# Patient Record
Sex: Female | Born: 1940
Health system: Southern US, Community
[De-identification: ages and names within clinical notes are randomized; demographics above are authoritative.]

## PROBLEM LIST (undated history)

## (undated) DIAGNOSIS — E042 Nontoxic multinodular goiter: Secondary | ICD-10-CM

## (undated) DIAGNOSIS — M1990 Unspecified osteoarthritis, unspecified site: Secondary | ICD-10-CM

## (undated) DIAGNOSIS — Z853 Personal history of malignant neoplasm of breast: Secondary | ICD-10-CM

## (undated) DIAGNOSIS — I00 Rheumatic fever without heart involvement: Secondary | ICD-10-CM

## (undated) DIAGNOSIS — E119 Type 2 diabetes mellitus without complications: Secondary | ICD-10-CM

## (undated) DIAGNOSIS — C801 Malignant (primary) neoplasm, unspecified: Secondary | ICD-10-CM

## (undated) DIAGNOSIS — R112 Nausea with vomiting, unspecified: Secondary | ICD-10-CM

## (undated) DIAGNOSIS — E039 Hypothyroidism, unspecified: Secondary | ICD-10-CM

## (undated) DIAGNOSIS — Z9889 Other specified postprocedural states: Secondary | ICD-10-CM

## (undated) DIAGNOSIS — I10 Essential (primary) hypertension: Secondary | ICD-10-CM

## (undated) HISTORY — PX: TONSILLECTOMY: SUR1361

## (undated) HISTORY — DX: Nontoxic multinodular goiter: E04.2

## (undated) HISTORY — PX: TONSILLECTOMY: SHX5217

## (undated) HISTORY — DX: Type 2 diabetes mellitus without complications: E11.9

## (undated) HISTORY — DX: Essential (primary) hypertension: I10

## (undated) HISTORY — DX: Rheumatic fever without heart involvement: I00

## (undated) HISTORY — DX: Personal history of malignant neoplasm of breast: Z85.3

## (undated) HISTORY — PX: APPENDECTOMY: SHX54

---

## 1991-12-29 DIAGNOSIS — Z853 Personal history of malignant neoplasm of breast: Secondary | ICD-10-CM

## 1991-12-29 HISTORY — PX: MASTECTOMY: SHX3

## 1991-12-29 HISTORY — PX: BREAST SURGERY: SHX581

## 1991-12-29 HISTORY — DX: Personal history of malignant neoplasm of breast: Z85.3

## 1998-06-04 ENCOUNTER — Ambulatory Visit (HOSPITAL_COMMUNITY): Admission: RE | Admit: 1998-06-04 | Discharge: 1998-06-04 | Payer: Self-pay | Admitting: General Surgery

## 1999-06-02 ENCOUNTER — Ambulatory Visit (HOSPITAL_COMMUNITY): Admission: RE | Admit: 1999-06-02 | Discharge: 1999-06-02 | Payer: Self-pay | Admitting: General Surgery

## 1999-06-02 ENCOUNTER — Encounter: Payer: Self-pay | Admitting: General Surgery

## 2002-03-03 ENCOUNTER — Encounter: Payer: Self-pay | Admitting: Family Medicine

## 2002-03-03 ENCOUNTER — Ambulatory Visit (HOSPITAL_COMMUNITY): Admission: RE | Admit: 2002-03-03 | Discharge: 2002-03-03 | Payer: Self-pay | Admitting: Family Medicine

## 2003-04-13 ENCOUNTER — Ambulatory Visit (HOSPITAL_COMMUNITY): Admission: RE | Admit: 2003-04-13 | Discharge: 2003-04-13 | Payer: Self-pay | Admitting: Family Medicine

## 2005-09-16 ENCOUNTER — Ambulatory Visit (HOSPITAL_COMMUNITY): Admission: RE | Admit: 2005-09-16 | Discharge: 2005-09-16 | Payer: Self-pay | Admitting: Family Medicine

## 2007-01-28 ENCOUNTER — Ambulatory Visit (HOSPITAL_COMMUNITY): Admission: RE | Admit: 2007-01-28 | Discharge: 2007-01-28 | Payer: Self-pay | Admitting: Family Medicine

## 2007-09-09 ENCOUNTER — Ambulatory Visit (HOSPITAL_COMMUNITY): Admission: RE | Admit: 2007-09-09 | Discharge: 2007-09-09 | Payer: Self-pay | Admitting: Internal Medicine

## 2007-09-09 ENCOUNTER — Ambulatory Visit: Payer: Self-pay | Admitting: Internal Medicine

## 2007-10-26 ENCOUNTER — Encounter (HOSPITAL_COMMUNITY): Admission: RE | Admit: 2007-10-26 | Discharge: 2007-11-25 | Payer: Self-pay | Admitting: Endocrinology

## 2008-09-20 ENCOUNTER — Other Ambulatory Visit: Admission: RE | Admit: 2008-09-20 | Discharge: 2008-09-20 | Payer: Self-pay | Admitting: Obstetrics and Gynecology

## 2009-11-13 ENCOUNTER — Ambulatory Visit (HOSPITAL_COMMUNITY): Admission: RE | Admit: 2009-11-13 | Discharge: 2009-11-13 | Payer: Self-pay | Admitting: Internal Medicine

## 2010-11-24 ENCOUNTER — Ambulatory Visit (HOSPITAL_COMMUNITY): Admission: RE | Admit: 2010-11-24 | Discharge: 2010-11-24 | Payer: Self-pay | Admitting: Internal Medicine

## 2011-05-12 NOTE — Op Note (Signed)
NAME:  Ashley Wise, Ashley Wise           ACCOUNT NO.:  [id number]   MEDICAL RECORD NO.:  [id number]          PATIENT TYPE:  AMB   LOCATION:  DAY                           FACILITY:  [facility]   PHYSICIAN:  R. Roetta Sessions, M.D. DATE OF BIRTH:  03/18/1941   DATE OF PROCEDURE:  09/09/2007  DATE OF DISCHARGE:                               OPERATIVE REPORT   PROCEDURE PERFORMED:  Screening colonoscopy.   INDICATIONS FOR PROCEDURE:  70 year old lady with no lower GI tract  symptoms referred by Dr. Ouida Sills for her first ever colonoscopy.  She has  a personal history of breast cancer, positive family history of colon  cancer in her dad who was diagnosed in his early 58s. Colonoscopy is now  being done.  This approach has been discussed with the patient at  length.  The potential risks, benefits and alternatives have been  reviewed, questions answered, she is agreeable, please see documentation  in the medical record.   PROCEDURE NOTE:  O2 saturation, blood pressure, pulse and respirations  were monitored throughout the entire procedure.  Conscious sedation with  Versed 2 mg IV and Demerol 50 mg IV in a single dose. Instrumentation  Pentax video chip system.   FINDINGS:  Digital rectal exam revealed no abnormalities.  The prep was  good.  The colonic mucosa was surveyed from the rectosigmoid junction  through the left, transverse, right colon to the appendiceal orifice,  ileocecal valve, and cecum.  These structures were well seen and  photographed for the record.  The ileocecal valve was somewhat fatty and  asymmetrical but otherwise, appeared normal.  I easily intubated the  terminal ileum 10 cm. From this level, the scope was slowly withdrawn.  All previously mentioned mucosal surfaces were again seen.  The patient  a few scattered left sided diverticula.  The remainder of the colonic  mucosa appeared normal. Normal terminal ileum mucosa.  The scope was  pulled down in the rectum where a thorough  examination of the rectal  mucosa including retroflex view of the anal verge demonstrated no  abnormalities.  The patient tolerated the procedure well and was reacted  in endoscopy.   IMPRESSION:  Normal rectum, a few scattered pancolonic diverticula, the  colonic mucosa of the terminal ileum appeared normal.   RECOMMENDATIONS:  1. Diverticulosis literature provided to [name].  2. Recommend repeat screening colonoscopy in five years.      Jonathon Bellows, M.D.  Electronically Signed     RMR/MEDQ  D:  09/09/2007  T:  09/09/2007  Job:  [id number]   cc:   Kingsley Callander. Ouida Sills, MD  Fax: [phone]

## 2011-09-15 ENCOUNTER — Other Ambulatory Visit: Payer: Self-pay | Admitting: Obstetrics & Gynecology

## 2011-09-15 DIAGNOSIS — E049 Nontoxic goiter, unspecified: Secondary | ICD-10-CM

## 2011-09-18 ENCOUNTER — Ambulatory Visit (HOSPITAL_COMMUNITY)
Admission: RE | Admit: 2011-09-18 | Discharge: 2011-09-18 | Disposition: A | Discharge status: Home / Self Care | Payer: Medicare Other | Source: Ambulatory Visit | Attending: Obstetrics & Gynecology | Admitting: Obstetrics & Gynecology

## 2011-09-18 DIAGNOSIS — E049 Nontoxic goiter, unspecified: Secondary | ICD-10-CM

## 2011-09-18 DIAGNOSIS — E042 Nontoxic multinodular goiter: Secondary | ICD-10-CM | POA: Insufficient documentation

## 2011-09-22 ENCOUNTER — Other Ambulatory Visit: Payer: Self-pay | Admitting: Obstetrics & Gynecology

## 2011-09-22 DIAGNOSIS — E049 Nontoxic goiter, unspecified: Secondary | ICD-10-CM

## 2011-09-28 ENCOUNTER — Other Ambulatory Visit: Payer: Self-pay | Admitting: Obstetrics & Gynecology

## 2011-09-28 DIAGNOSIS — E042 Nontoxic multinodular goiter: Secondary | ICD-10-CM

## 2011-09-29 ENCOUNTER — Ambulatory Visit (HOSPITAL_COMMUNITY): Payer: Medicare Other

## 2011-09-29 ENCOUNTER — Encounter (HOSPITAL_COMMUNITY): Payer: Medicare Other

## 2011-10-02 ENCOUNTER — Ambulatory Visit (HOSPITAL_COMMUNITY)
Admission: RE | Admit: 2011-10-02 | Discharge: 2011-10-02 | Disposition: A | Discharge status: Home / Self Care | Payer: Medicare Other | Source: Ambulatory Visit | Attending: Obstetrics & Gynecology | Admitting: Obstetrics & Gynecology

## 2011-10-02 ENCOUNTER — Other Ambulatory Visit: Payer: Self-pay | Admitting: Obstetrics & Gynecology

## 2011-10-02 ENCOUNTER — Inpatient Hospital Stay (HOSPITAL_COMMUNITY): Admission: RE | Admit: 2011-10-02 | Discharge: 2011-10-02 | Payer: Self-pay | Source: Ambulatory Visit

## 2011-10-02 DIAGNOSIS — E049 Nontoxic goiter, unspecified: Secondary | ICD-10-CM

## 2011-10-02 DIAGNOSIS — E042 Nontoxic multinodular goiter: Secondary | ICD-10-CM

## 2011-10-02 NOTE — Procedures (Signed)
PreOperative Dx: Multinodular goiter Postoperative Dx: Multinodular goiter Procedure:   US guided thyroid nodule FNA x 2 Radiologist:  Tyron Russell Anesthesia:  1 ml 1% xylocaine Specimen:  FNA x 3 right/isthmic, FNA x 4 left EBL:   None Complications: None

## 2011-10-02 NOTE — Progress Notes (Signed)
1040  Time out performed by all staff members present.  Pt for US Thyroid Biopsy. 1045  Lidocaine 5 cc injected into mid left neck area and procedure started. 1100 Procedure complete.  Pt tolerated procedure well.

## 2011-11-10 ENCOUNTER — Ambulatory Visit (INDEPENDENT_AMBULATORY_CARE_PROVIDER_SITE_OTHER): Payer: Medicare Other | Admitting: Surgery

## 2011-11-10 ENCOUNTER — Encounter (INDEPENDENT_AMBULATORY_CARE_PROVIDER_SITE_OTHER): Payer: Self-pay | Admitting: Surgery

## 2011-11-10 DIAGNOSIS — E0520 Thyrotoxicosis with toxic multinodular goiter without thyrotoxic crisis or storm: Secondary | ICD-10-CM

## 2011-11-10 NOTE — Progress Notes (Signed)
Chief Complaint  Patient presents with  . New Evaluation    multinodular goiter - referral by Dr. Turner Daniels / Dr. Carylon Perches    HISTORY: Patient is a 70 year old white female referred by her gynecologist and her primary care physician for evaluation of multinodular thyroid goiter. Patient was originally diagnosed with goiter 19 years ago. It has gradually increased in size. Recent ultrasound showed the left lobe to be enlarged at 7.9 cm and the right lobe of the enlarged at 6.2 cm. There are dominant nodules ranging from 3-5 cm. Fine needle aspiration biopsy has been performed and shows nonneoplastic goiter. However a recent TSH level is suppressed at 0.2. This suggests onset of toxic multinodular goiter.  Patient does have palpitations. She does note tremors. She does note that the gland has increased in size and she has had increased dysphagia, specifically with liquids.  Patient denies any globus sensation. She denies discomfort.  There is no family history of thyroid disease. The patient has had no other head or neck surgery. There is no family history of other endocrinopathy.   Past Medical History  Diagnosis Date  . History of breast cancer     left  . Multinodular goiter      Current Outpatient Prescriptions  Medication Sig Dispense Refill  . B Complex-C (B-COMPLEX WITH VITAMIN C) tablet Take 1 tablet by mouth daily.        . fish oil-omega-3 fatty acids 1000 MG capsule Take 2 g by mouth daily.           No Known Allergies   Family History  Problem Relation Age of Onset  . Cancer Father     colon, stomach, liver     History   Social History  . Marital Status: Married    Spouse Name: N/A    Number of Children: N/A  . Years of Education: N/A   Social History Main Topics  . Smoking status: Never Smoker   . Smokeless tobacco: None  . Alcohol Use: No  . Drug Use: No  . Sexually Active:    Other Topics Concern  . None   Social History Narrative  . None      REVIEW OF SYSTEMS - PERTINENT POSITIVES ONLY: Positive for palpitations, positive for tremor, positive for liquid dysphagia.   EXAM: Filed Vitals:   11/10/11 1527  BP: 160/68  Pulse: 72  Temp: 97.5 F (36.4 C)  Resp: 16    HEENT: normocephalic; pupils equal and reactive; sclerae clear; dentition good; mucous membranes moist NECK:  Large thyroid gland with multiple nodules bilaterally; asymmetric on extension with left greater than right; no palpable anterior or posterior cervical lymphadenopathy; no supraclavicular masses; no tenderness CHEST: clear to auscultation bilaterally without rales, rhonchi, or wheezes CARDIAC: regular rate and rhythm without significant murmur; peripheral pulses are full EXT:  non-tender without edema; no deformity NEURO: no gross focal deficits; no sign of tremor   LABORATORY RESULTS: See E-Chart for most recent results   RADIOLOGY RESULTS: See E-Chart or I-Site for most recent results   IMPRESSION: #1 multinodular goiter with mild to moderate compressive symptoms #2 hyperthyroidism, moderately symptomatic   PLAN: I had a lengthy discussion with the patient and her sister regarding options for management. I have recommended total thyroidectomy. We discussed the risks and benefits of this procedure including the potential for recurrent laryngeal nerve injury and injury to parathyroid glands. We discussed the operation, the hospital stay, and the postoperative recovery period we discussed  the need for lifelong thyroid hormone replacement therapy. They understand and agree to proceed. We will make arrangements for surgery at a time convenient for the patient in the near future.  The risks and benefits of the procedure have been discussed at length with the patient.  The patient understands the proposed procedure, potential alternative treatments, and the course of recovery to be expected.  All of the patient's questions have been answered at this  time.  The patient wishes to proceed with surgery and will schedule a date for their procedure through our office staff.   Velora Heckler, MD, FACS General & Endocrine Surgery [facility]    Visit Diagnoses: 1. Goiter, toxic, multinodular     Primary Care Physician: Carylon Perches, MD  GYN:  Dr. Turner Daniels

## 2011-11-17 ENCOUNTER — Other Ambulatory Visit: Payer: Self-pay | Admitting: Obstetrics & Gynecology

## 2011-11-17 DIAGNOSIS — Z139 Encounter for screening, unspecified: Secondary | ICD-10-CM

## 2011-11-27 ENCOUNTER — Ambulatory Visit (HOSPITAL_COMMUNITY)
Admission: RE | Admit: 2011-11-27 | Discharge: 2011-11-27 | Disposition: A | Discharge status: Home / Self Care | Payer: Medicare Other | Source: Ambulatory Visit | Attending: Obstetrics & Gynecology | Admitting: Obstetrics & Gynecology

## 2011-11-27 DIAGNOSIS — Z139 Encounter for screening, unspecified: Secondary | ICD-10-CM

## 2011-11-27 DIAGNOSIS — Z1231 Encounter for screening mammogram for malignant neoplasm of breast: Secondary | ICD-10-CM | POA: Insufficient documentation

## 2011-12-14 ENCOUNTER — Ambulatory Visit (HOSPITAL_COMMUNITY): Admission: RE | Admit: 2011-12-14 | Payer: Medicare Other | Source: Ambulatory Visit | Admitting: Surgery

## 2011-12-14 ENCOUNTER — Encounter (HOSPITAL_COMMUNITY): Admission: RE | Payer: Self-pay | Source: Ambulatory Visit

## 2011-12-14 SURGERY — THYROIDECTOMY
Anesthesia: General

## 2012-01-11 ENCOUNTER — Encounter (HOSPITAL_COMMUNITY): Payer: Self-pay | Admitting: Pharmacy Technician

## 2012-01-21 ENCOUNTER — Encounter (HOSPITAL_COMMUNITY): Payer: Self-pay

## 2012-01-21 ENCOUNTER — Encounter (HOSPITAL_COMMUNITY)
Admission: RE | Admit: 2012-01-21 | Discharge: 2012-01-21 | Disposition: A | Discharge status: Home / Self Care | Payer: Medicare Other | Source: Ambulatory Visit | Attending: Surgery | Admitting: Surgery

## 2012-01-21 ENCOUNTER — Ambulatory Visit (HOSPITAL_COMMUNITY)
Admission: RE | Admit: 2012-01-21 | Discharge: 2012-01-21 | Disposition: A | Discharge status: Home / Self Care | Payer: Medicare Other | Source: Ambulatory Visit | Attending: Surgery | Admitting: Surgery

## 2012-01-21 ENCOUNTER — Other Ambulatory Visit: Payer: Self-pay

## 2012-01-21 DIAGNOSIS — Z9010 Acquired absence of unspecified breast and nipple: Secondary | ICD-10-CM | POA: Insufficient documentation

## 2012-01-21 DIAGNOSIS — E049 Nontoxic goiter, unspecified: Secondary | ICD-10-CM | POA: Insufficient documentation

## 2012-01-21 HISTORY — DX: Other specified postprocedural states: Z98.890

## 2012-01-21 HISTORY — DX: Other specified postprocedural states: R11.2

## 2012-01-21 HISTORY — DX: Malignant (primary) neoplasm, unspecified: C80.1

## 2012-01-21 HISTORY — DX: Unspecified osteoarthritis, unspecified site: M19.90

## 2012-01-21 LAB — CBC
MCV: 88.8 fL (ref 78.0–100.0)
Platelets: 263 10*3/uL (ref 150–400)
RBC: 4.29 MIL/uL (ref 3.87–5.11)
RDW: 13 % (ref 11.5–15.5)
WBC: 7.2 10*3/uL (ref 4.0–10.5)

## 2012-01-21 LAB — BASIC METABOLIC PANEL
Creatinine, Ser: 0.7 mg/dL (ref 0.50–1.10)
GFR calc non Af Amer: 86 mL/min — ABNORMAL LOW (ref >90)
Sodium: 140 mEq/L (ref 135–145)

## 2012-01-21 LAB — URINALYSIS, ROUTINE W REFLEX MICROSCOPIC
Glucose, UA: NEGATIVE mg/dL
Ketones, ur: NEGATIVE mg/dL
Protein, ur: NEGATIVE mg/dL
Urobilinogen, UA: 0.2 mg/dL (ref 0.0–1.0)

## 2012-01-21 LAB — SURGICAL PCR SCREEN: MRSA, PCR: NEGATIVE

## 2012-01-21 LAB — PROTIME-INR
INR: 0.94 (ref 0.00–1.49)
Prothrombin Time: 12.8 seconds (ref 11.6–15.2)

## 2012-01-21 LAB — URINE MICROSCOPIC-ADD ON

## 2012-01-21 NOTE — Patient Instructions (Addendum)
20 Ashley Wise  01/21/2012   Your procedure is scheduled on:  Monday 1/28  AT 7:30 AM  Report to [facility] at 5:30 AM.  Call this number if you have problems the morning of surgery: [phone]   Remember:   Do not eat food OR DRINK ANYTHING AFTER MIDNIGHT THE NIGHT BEFORE SURGERY.    Take these medicines the morning of surgery with A SIP OF WATER: NO MEDICINES TO TAKE   Do not wear jewelry, make-up or nail polish.  Do not wear lotions, powders, or perfumes.   Do not shave 48 hours prior to surgery.  Do not bring valuables to the hospital.  Contacts, dentures or bridgework may not be worn into surgery.  Leave suitcase in the car. After surgery it may be brought to your room.  For patients admitted to the hospital, checkout time is 11:00 AM the day of discharge.   Patients discharged the day of surgery will not be allowed to drive home.    Special Instructions: CHG Shower Use Special Wash: 1/2 bottle night before surgery and 1/2 bottle morning of surgery.   Please read over the following fact sheets that you were given: MRSA Information

## 2012-01-25 ENCOUNTER — Encounter (HOSPITAL_COMMUNITY): Payer: Self-pay | Admitting: Anesthesiology

## 2012-01-25 ENCOUNTER — Encounter (HOSPITAL_COMMUNITY)
Admission: RE | Disposition: A | Discharge status: Home / Self Care | Payer: Self-pay | Source: Ambulatory Visit | Attending: Surgery

## 2012-01-25 ENCOUNTER — Encounter (HOSPITAL_COMMUNITY): Payer: Self-pay

## 2012-01-25 ENCOUNTER — Inpatient Hospital Stay (HOSPITAL_COMMUNITY)
Admission: RE | Admit: 2012-01-25 | Discharge: 2012-01-26 | DRG: 627 | Disposition: A | Discharge status: Home / Self Care | Payer: Medicare Other | Source: Ambulatory Visit | Attending: Surgery | Admitting: Surgery

## 2012-01-25 ENCOUNTER — Other Ambulatory Visit (INDEPENDENT_AMBULATORY_CARE_PROVIDER_SITE_OTHER): Payer: Self-pay | Admitting: Surgery

## 2012-01-25 ENCOUNTER — Ambulatory Visit (HOSPITAL_COMMUNITY): Payer: Medicare Other | Admitting: Anesthesiology

## 2012-01-25 DIAGNOSIS — Z853 Personal history of malignant neoplasm of breast: Secondary | ICD-10-CM

## 2012-01-25 DIAGNOSIS — E0520 Thyrotoxicosis with toxic multinodular goiter without thyrotoxic crisis or storm: Secondary | ICD-10-CM

## 2012-01-25 DIAGNOSIS — E042 Nontoxic multinodular goiter: Principal | ICD-10-CM | POA: Diagnosis present

## 2012-01-25 DIAGNOSIS — Z9010 Acquired absence of unspecified breast and nipple: Secondary | ICD-10-CM

## 2012-01-25 HISTORY — PX: THYROIDECTOMY: SHX17

## 2012-01-25 SURGERY — THYROIDECTOMY
Anesthesia: General | Site: Abdomen | Wound class: Clean

## 2012-01-25 MED ORDER — ONDANSETRON HCL 4 MG/2ML IJ SOLN
INTRAMUSCULAR | Status: DC | PRN
  Administered 2012-01-25: 4 mg via INTRAVENOUS
  Administered 2012-01-25: 2 mg via INTRAVENOUS

## 2012-01-25 MED ORDER — FENTANYL CITRATE 0.05 MG/ML IJ SOLN
INTRAMUSCULAR | Status: DC | PRN
  Administered 2012-01-25 (×2): 50 ug via INTRAVENOUS
  Administered 2012-01-25: 25 ug via INTRAVENOUS
  Administered 2012-01-25: 100 ug via INTRAVENOUS
  Administered 2012-01-25 (×2): 50 ug via INTRAVENOUS

## 2012-01-25 MED ORDER — GLYCOPYRROLATE 0.2 MG/ML IJ SOLN
INTRAMUSCULAR | Status: DC | PRN
  Administered 2012-01-25: .5 mg via INTRAVENOUS

## 2012-01-25 MED ORDER — CALCIUM CARBONATE 1250 (500 CA) MG PO TABS
1250.0000 mg | ORAL_TABLET | Freq: Three times a day (TID) | ORAL | Status: DC
  Administered 2012-01-25 – 2012-01-26 (×4): 1250 mg via ORAL
  Filled 2012-01-25 (×6): qty 1

## 2012-01-25 MED ORDER — METOCLOPRAMIDE HCL 5 MG/ML IJ SOLN
INTRAMUSCULAR | Status: DC | PRN
  Administered 2012-01-25: 10 mg via INTRAVENOUS

## 2012-01-25 MED ORDER — HYDROMORPHONE HCL PF 1 MG/ML IJ SOLN: 1.0000 mg | INTRAMUSCULAR | Status: DC | PRN

## 2012-01-25 MED ORDER — LIDOCAINE HCL (CARDIAC) 20 MG/ML IV SOLN
INTRAVENOUS | Status: DC | PRN
  Administered 2012-01-25: 40 mg via INTRAVENOUS

## 2012-01-25 MED ORDER — ACETAMINOPHEN 10 MG/ML IV SOLN
INTRAVENOUS | Status: DC | PRN
  Administered 2012-01-25: 1000 mg via INTRAVENOUS

## 2012-01-25 MED ORDER — NEOSTIGMINE METHYLSULFATE 1 MG/ML IJ SOLN
INTRAMUSCULAR | Status: DC | PRN
  Administered 2012-01-25: 2.5 mg via INTRAVENOUS

## 2012-01-25 MED ORDER — PROMETHAZINE HCL 25 MG/ML IJ SOLN: 6.2500 mg | INTRAMUSCULAR | Status: DC | PRN

## 2012-01-25 MED ORDER — PROPOFOL 10 MG/ML IV EMUL
INTRAVENOUS | Status: DC | PRN
  Administered 2012-01-25: 125 mg via INTRAVENOUS

## 2012-01-25 MED ORDER — LACTATED RINGERS IV SOLN
INTRAVENOUS | Status: DC | PRN
  Administered 2012-01-25: 07:00:00 via INTRAVENOUS

## 2012-01-25 MED ORDER — LACTATED RINGERS IV SOLN: INTRAVENOUS | Status: DC

## 2012-01-25 MED ORDER — PROMETHAZINE HCL 25 MG/ML IJ SOLN
12.5000 mg | Freq: Four times a day (QID) | INTRAMUSCULAR | Status: DC | PRN
  Administered 2012-01-25: 12.5 mg via INTRAVENOUS
  Filled 2012-01-25: qty 1

## 2012-01-25 MED ORDER — FENTANYL CITRATE 0.05 MG/ML IJ SOLN: 25.0000 ug | INTRAMUSCULAR | Status: DC | PRN

## 2012-01-25 MED ORDER — MEPERIDINE HCL 25 MG/ML IJ SOLN: 6.2500 mg | INTRAMUSCULAR | Status: DC | PRN

## 2012-01-25 MED ORDER — HYDROCODONE-ACETAMINOPHEN 5-325 MG PO TABS: 1.0000 | ORAL_TABLET | ORAL | Status: DC | PRN

## 2012-01-25 MED ORDER — SCOPOLAMINE 1 MG/3DAYS TD PT72
MEDICATED_PATCH | TRANSDERMAL | Status: DC | PRN
  Administered 2012-01-25: 1 via TRANSDERMAL

## 2012-01-25 MED ORDER — KCL IN DEXTROSE-NACL 30-5-0.45 MEQ/L-%-% IV SOLN
INTRAVENOUS | Status: DC
  Administered 2012-01-25 (×2): via INTRAVENOUS
  Filled 2012-01-25 (×5): qty 1000

## 2012-01-25 MED ORDER — ROCURONIUM BROMIDE 100 MG/10ML IV SOLN
INTRAVENOUS | Status: DC | PRN
  Administered 2012-01-25: 30 mg via INTRAVENOUS

## 2012-01-25 MED ORDER — ACETAMINOPHEN 325 MG PO TABS
650.0000 mg | ORAL_TABLET | ORAL | Status: DC | PRN
  Administered 2012-01-25 – 2012-01-26 (×2): 650 mg via ORAL
  Filled 2012-01-25 (×2): qty 2

## 2012-01-25 MED ORDER — CEFAZOLIN SODIUM 1-5 GM-% IV SOLN
INTRAVENOUS | Status: DC | PRN
  Administered 2012-01-25: 1 g via INTRAVENOUS

## 2012-01-25 MED ORDER — DROPERIDOL 2.5 MG/ML IJ SOLN
INTRAMUSCULAR | Status: DC | PRN
  Administered 2012-01-25: 0.625 mg via INTRAVENOUS

## 2012-01-25 MED ORDER — DEXAMETHASONE SODIUM PHOSPHATE 4 MG/ML IJ SOLN
INTRAMUSCULAR | Status: DC | PRN
  Administered 2012-01-25: 10 mg via INTRAVENOUS

## 2012-01-25 MED ORDER — 0.9 % SODIUM CHLORIDE (POUR BTL) OPTIME
TOPICAL | Status: DC | PRN
  Administered 2012-01-25: 1000 mL

## 2012-01-25 MED ORDER — EPHEDRINE SULFATE 50 MG/ML IJ SOLN
INTRAMUSCULAR | Status: DC | PRN
  Administered 2012-01-25 (×4): 5 mg via INTRAVENOUS

## 2012-01-25 MED ORDER — ONDANSETRON HCL 4 MG/2ML IJ SOLN
4.0000 mg | Freq: Four times a day (QID) | INTRAMUSCULAR | Status: DC | PRN
  Administered 2012-01-25: 4 mg via INTRAVENOUS
  Filled 2012-01-25: qty 2

## 2012-01-25 SURGICAL SUPPLY — 39 items
APL SKNCLS STERI-STRIP NONHPOA (GAUZE/BANDAGES/DRESSINGS) ×1
ATTRACTOMAT 16X20 MAGNETIC DRP (DRAPES) ×2 IMPLANT
BENZOIN TINCTURE PRP APPL 2/3 (GAUZE/BANDAGES/DRESSINGS) ×2 IMPLANT
BLADE HEX COATED 2.75 (ELECTRODE) ×2 IMPLANT
BLADE SURG 15 STRL LF DISP TIS (BLADE) ×1 IMPLANT
BLADE SURG 15 STRL SS (BLADE) ×2
CANISTER SUCTION 2500CC (MISCELLANEOUS) ×2 IMPLANT
CHLORAPREP W/TINT 10.5 ML (MISCELLANEOUS) ×2 IMPLANT
CLIP TI MEDIUM 6 (CLIP) ×8 IMPLANT
CLIP TI WIDE RED SMALL 6 (CLIP) ×5 IMPLANT
CLOTH BEACON ORANGE TIMEOUT ST (SAFETY) ×2 IMPLANT
DISSECTOR ROUND CHERRY 3/8 STR (MISCELLANEOUS) IMPLANT
DRAPE PED LAPAROTOMY (DRAPES) ×2 IMPLANT
DRESSING SURGICEL FIBRLLR 1X2 (HEMOSTASIS) ×1 IMPLANT
DRSG SURGICEL FIBRILLAR 1X2 (HEMOSTASIS) ×4
ELECT REM PT RETURN 9FT ADLT (ELECTROSURGICAL) ×2
ELECTRODE REM PT RTRN 9FT ADLT (ELECTROSURGICAL) ×1 IMPLANT
GAUZE SPONGE 4X4 16PLY XRAY LF (GAUZE/BANDAGES/DRESSINGS) ×3 IMPLANT
GLOVE SURG ORTHO 8.0 STRL STRW (GLOVE) ×2 IMPLANT
GOWN STRL NON-REIN LRG LVL3 (GOWN DISPOSABLE) ×2 IMPLANT
GOWN STRL REIN XL XLG (GOWN DISPOSABLE) ×4 IMPLANT
KIT BASIN OR (CUSTOM PROCEDURE TRAY) ×2 IMPLANT
NS IRRIG 1000ML POUR BTL (IV SOLUTION) ×2 IMPLANT
PACK BASIC VI WITH GOWN DISP (CUSTOM PROCEDURE TRAY) ×2 IMPLANT
PENCIL BUTTON HOLSTER BLD 10FT (ELECTRODE) ×2 IMPLANT
SHEARS HARMONIC 9CM CVD (BLADE) ×2 IMPLANT
SPONGE GAUZE 4X4 12PLY (GAUZE/BANDAGES/DRESSINGS) ×1 IMPLANT
STAPLER VISISTAT 35W (STAPLE) ×2 IMPLANT
STRIP CLOSURE SKIN 1/2X4 (GAUZE/BANDAGES/DRESSINGS) ×2 IMPLANT
SUT MNCRL AB 4-0 PS2 18 (SUTURE) ×2 IMPLANT
SUT SILK 2 0 (SUTURE) ×2
SUT SILK 2-0 18XBRD TIE 12 (SUTURE) ×1 IMPLANT
SUT SILK 3 0 (SUTURE)
SUT SILK 3-0 18XBRD TIE 12 (SUTURE) IMPLANT
SUT VIC AB 3-0 SH 18 (SUTURE) ×2 IMPLANT
SYR BULB IRRIGATION 50ML (SYRINGE) ×2 IMPLANT
TAPE CLOTH SURG 4X10 WHT LF (GAUZE/BANDAGES/DRESSINGS) ×1 IMPLANT
TOWEL OR 17X26 10 PK STRL BLUE (TOWEL DISPOSABLE) ×2 IMPLANT
YANKAUER SUCT BULB TIP 10FT TU (MISCELLANEOUS) ×2 IMPLANT

## 2012-01-25 NOTE — H&P (Signed)
Ashley Wise is an 71 y.o. female.    Chief Complaint: Thyroid goiter  HPI: Patient is a 71 year old white female referred by her gynecologist and her primary care physician for evaluation of multinodular thyroid goiter. Patient was originally diagnosed with goiter 19 years ago. It has gradually increased in size. Recent ultrasound showed the left lobe to be enlarged at 7.9 cm and the right lobe of the enlarged at 6.2 cm. There are dominant nodules ranging from 3-5 cm. Fine needle aspiration biopsy has been performed and shows nonneoplastic goiter. However a recent TSH level is suppressed at 0.2. This suggests onset of toxic multinodular goiter.  Patient does have palpitations. She does note tremors. She does note that the gland has increased in size and she has had increased dysphagia, specifically with liquids. Patient denies any globus sensation. She denies discomfort.  There is no family history of thyroid disease. The patient has had no other head or neck surgery. There is no family history of other endocrinopathy.    Past Medical History  Diagnosis Date  . History of breast cancer 1993    left -MASTECTOMY AND AXILLARY NODE DISSECTION -NO CHEMO OR RADIATION  . Multinodular goiter     ENLARGED THROID AND NODULES--NO PROBLEMS SWALLOWING, OCCAS RASPY VOICE AND FEELING THAT SHE NEEDS TO CLEAR HER THROAT, OCCAS HAND TREMOR.  OCCAS PALPITATIONS  . Cancer     BREAST CANCER  . Arthritis     HANDS  . PONV (postoperative nausea and vomiting)     Past Surgical History  Procedure Date  . Mastectomy 1993    left  . Appendectomy   . Tonsillectomy   . Cesarean section 1964, 1974  . Breast surgery 1993    LEFT MASTECTOMY AND AXILLARY NODE DISSECTION    Family History  Problem Relation Age of Onset  . Cancer Father     colon, stomach, liver   Social History:  reports that she has never smoked. She has never used smokeless tobacco. She reports that she does not drink alcohol or use  illicit drugs.  Allergies: No Known Allergies  Medications Prior to Admission  Medication Dose Route Frequency Provider Last Rate Last Dose  . 0.9 % irrigation (POUR BTL)    PRN Velora Heckler, MD   1,000 mL at 01/25/12 1610   Medications Prior to Admission  Medication Sig Dispense Refill  . B Complex-C (B-COMPLEX WITH VITAMIN C) tablet Take 1 tablet by mouth daily.       . fish oil-omega-3 fatty acids 1000 MG capsule Take by mouth daily.         No results found for this or any previous visit (from the past 48 hour(s)). No results found.  Review of Systems  Constitutional: Negative.   HENT:       Thyroid goiter  Eyes: Negative.   Respiratory: Negative.   Cardiovascular: Negative.   Gastrointestinal: Negative.   Genitourinary: Negative.   Musculoskeletal: Negative.   Skin: Negative.   Neurological: Negative.   Endo/Heme/Allergies: Negative.   Psychiatric/Behavioral: Negative.     Blood pressure 140/64, pulse 76, temperature 98 F (36.7 C), resp. rate 20, SpO2 98.00%. Physical Exam  Constitutional: She is oriented to person, place, and time. She appears well-developed and well-nourished.  HENT:  Head: Normocephalic and atraumatic.  Right Ear: External ear normal.  Left Ear: External ear normal.  Nose: Nose normal.  Mouth/Throat: Oropharynx is clear and moist.  Eyes: EOM are normal. Pupils are equal, round, and  reactive to light.  Neck: Normal range of motion. Thyromegaly present.  Cardiovascular: Normal rate, regular rhythm, normal heart sounds and intact distal pulses.   Respiratory: Effort normal and breath sounds normal.  GI: Soft. Bowel sounds are normal.  Musculoskeletal: Normal range of motion.  Neurological: She is alert and oriented to person, place, and time. She has normal reflexes.  Skin: Skin is warm and dry.  Psychiatric: She has a normal mood and affect. Her behavior is normal. Judgment and thought content normal.     Assessment/Plan Multinodular  thyroid goiter  Plan total thyroidectomy  The risks and benefits of the procedure have been discussed at length with the patient.  The patient understands the proposed procedure, potential alternative treatments, and the course of recovery to be expected.  All of the patient's questions have been answered at this time.  The patient wishes to proceed with surgery.  Velora Heckler, MD, FACS General & Endocrine Surgery [facility]    Aniello Christopoulos M 01/25/2012, 7:34 AM

## 2012-01-25 NOTE — Brief Op Note (Signed)
01/25/2012  9:37 AM  PATIENT:  Ashley Wise  71 y.o. female  PRE-OPERATIVE DIAGNOSIS:  toxic multinodular goiter  POST-OPERATIVE DIAGNOSIS:  toxic multinodular goiter  PROCEDURE:  Procedure(s): THYROIDECTOMY  SURGEON:  Surgeon(s): Velora Heckler, MD  ASSISTANTS: none   ANESTHESIA:   general  EBL:  Total I/O In: -  Out: 100 [Blood:100]  BLOOD ADMINISTERED:none  DRAINS: none   LOCAL MEDICATIONS USED:  NONE  SPECIMEN:  Excision  DISPOSITION OF SPECIMEN:  PATHOLOGY  COUNTS:  YES  DICTATION: .Other Dictation: Dictation Number [id number]  PLAN OF CARE: Admit for overnight observation  PATIENT DISPOSITION:  PACU - hemodynamically stable.  Velora Heckler, MD, FACS General & Endocrine Surgery [facility]

## 2012-01-25 NOTE — Anesthesia Postprocedure Evaluation (Signed)
  Anesthesia Post-op Note  Patient: Ashley Wise  Procedure(s) Performed:  THYROIDECTOMY - Total Thyroidectomy  Patient Location: PACU  Anesthesia Type: General  Level of Consciousness: awake and alert   Airway and Oxygen Therapy: Patient Spontanous Breathing  Post-op Pain: mild  Post-op Assessment: Post-op Vital signs reviewed, Patient's Cardiovascular Status Stable, Respiratory Function Stable, Patent Airway and No signs of Nausea or vomiting  Post-op Vital Signs: stable  Complications: No apparent anesthesia complications

## 2012-01-25 NOTE — Op Note (Signed)
NAME:  CLIFFIE, GINGRAS NO.:  [id number]  MEDICAL RECORD NO.:  [id number]  LOCATION:  1537                         FACILITY:  [facility]  PHYSICIAN:  Velora Heckler, MD      DATE OF BIRTH:  [birth year]  DATE OF PROCEDURE:  01/25/2012                               OPERATIVE REPORT   PREOPERATIVE DIAGNOSIS:  Multinodular thyroid goiter.  POSTOPERATIVE DIAGNOSIS:  Multinodular thyroid goiter.  PROCEDURE:  Total thyroidectomy.  SURGEON:  Velora Heckler, MD, FACS  ANESTHESIA:  General.  ESTIMATED BLOOD LOSS:  100 cc.  PREPARATION:  ChloraPrep.  COMPLICATIONS:  None.  INDICATIONS:  Patient is a 71 year old white female, referred by her gynecologist and her primary care physician for evaluation of multinodular thyroid goiter.  Patient was originally diagnosed 19 years ago.  This has gradually increased in size.  She was having mild compressive symptoms.  She also was noted to be borderline hyperthyroid. She now comes to surgery for thyroidectomy.  BODY OF REPORT:  Procedure was done in OR #1 at the [facility].  Patient was brought to the operating room, placed in a supine position on the operating room table.  Following administration of general anesthesia, the patient was positioned and then prepped and draped in the usual strict aseptic fashion.  After ascertaining that an adequate level of anesthesia had been achieved, a Kocher incision was made with a #15 blade.  Dissection was carried through subcutaneous tissues and platysma.  Hemostasis was obtained with electrocautery.  Skin flaps were elevated cephalad and caudad from the thyroid notch to the sternal notch.  A Mahorner self-retaining retractor was placed for exposure.  Strap muscles were incised in the midline. Dissection was begun on the left.  Left lobe was markedly enlarged. With gentle blunt dissection, it was mobilized.  Venous tributaries were divided between medium Ligaclips with  the Harmonic scalpel.  Gland was gently mobilized out of the left neck and delivered up and through the wound.  It was markedly enlarged at approximately 9 cm in greatest dimension.  Venous tributaries were divided between medium Ligaclips with the Harmonic scalpel.  Care was taken to dissect lateral and posterior tissues away from the gland at the level of the capsule in order to preserve the parathyroid tissue and recurrent nerve.  Branches of the inferior thyroid artery were divided between small and medium Ligaclips with the Harmonic scalpel.  Superior pole vessels were divided between medium Ligaclips with the Harmonic scalpel.  Gland was rolled anteriorly and the ligament of Allyson Sabal was released with the electrocautery.  Gland was mobilized onto the trachea.  There was a moderate-sized pyramidal lobe, which also contains nodules.  It was gently dissected off the anterior thyroid cartilage and included with the resection.  Dry pack was placed in the left neck.  Next, we turned our attention to the right thyroid lobe.  Right lobe appears to be multilobular.  It extends well cephalad and well posterior.  It was gently mobilized.  Large venous tributaries were divided between medium Ligaclips with the Harmonic scalpel.  Gland was mobilized and delivered up and into the wound.  Superior pole vessels were divided between medium Ligaclips with  the Harmonic scalpel. Branches of the inferior thyroid artery were divided between small and medium Ligaclips with the Harmonic scalpel.  Inferior venous tributaries were divided between medium Ligaclips with the Harmonic scalpel. Recurrent laryngeal nerve was identified and preserved.  Ligament of Allyson Sabal was released with the electrocautery and the gland was mobilized up and onto the anterior trachea from which it was completely resected with the Harmonic scalpel.  Sutures used to mark the left superior pole. The entire thyroid gland was submitted to  Pathology for review.  Neck was irrigated with warm saline and good hemostasis was achieved bilaterally.  Surgicel was placed throughout the operative field.  Strap muscles were reapproximated in the midline with interrupted 3-0 Vicryl sutures.  Platysma was closed with interrupted 3-0 Vicryl sutures.  Skin was closed with running 4-0 Monocryl subcuticular suture.  Wound was washed and dried and Benzoin and Steri-Strips were applied.  Sterile dressings were applied.  Patient was awakened from anesthesia and brought to the recovery room.  The patient tolerated the procedure well.   Velora Heckler, MD, FACS   TMG/MEDQ  D:  01/25/2012  T:  01/25/2012  Job:  [id number]  cc:   Kingsley Callander. Ouida Sills, MD Fax: [phone]  Lazaro Arms, M.D. Fax: [phone]

## 2012-01-25 NOTE — Anesthesia Preprocedure Evaluation (Addendum)
Anesthesia Evaluation  Patient identified by MRN, date of birth, ID band Patient awake    Reviewed: Allergy & Precautions, H&P , NPO status , Patient's Chart, lab work & pertinent test results  History of Anesthesia Complications (+) PONV  Airway Mallampati: II TM Distance: >3 FB Neck ROM: Full   Comment: Receeding chin.  Dental No notable dental hx.    Pulmonary neg pulmonary ROS,  clear to auscultation  Pulmonary exam normal       Cardiovascular neg cardio ROS Regular Normal    Neuro/Psych Negative Neurological ROS  Negative Psych ROS   GI/Hepatic negative GI ROS, Neg liver ROS,   Endo/Other  Negative Endocrine ROSHyperthyroidism   Renal/GU negative Renal ROS  Genitourinary negative   Musculoskeletal negative musculoskeletal ROS (+)   Abdominal   Peds negative pediatric ROS (+)  Hematology negative hematology ROS (+)   Anesthesia Other Findings   Reproductive/Obstetrics negative OB ROS                          Anesthesia Physical Anesthesia Plan  ASA: II  Anesthesia Plan: General   Post-op Pain Management:    Induction: Intravenous  Airway Management Planned: Oral ETT  Additional Equipment:   Intra-op Plan:   Post-operative Plan: Extubation in OR  Informed Consent: I have reviewed the patients History and Physical, chart, labs and discussed the procedure including the risks, benefits and alternatives for the proposed anesthesia with the patient or authorized representative who has indicated his/her understanding and acceptance.   Dental advisory given  Plan Discussed with: CRNA  Anesthesia Plan Comments:         Anesthesia Quick Evaluation

## 2012-01-25 NOTE — Transfer of Care (Signed)
Immediate Anesthesia Transfer of Care Note  Patient: Ashley Wise  Procedure(s) Performed:  THYROIDECTOMY - Total Thyroidectomy  Patient Location: PACU  Anesthesia Type: General  Level of Consciousness: awake, oriented, patient cooperative and responds to stimulation  Airway & Oxygen Therapy: Patient Spontanous Breathing and Patient connected to face mask oxygen  Post-op Assessment: Report given to PACU RN, Post -op Vital signs reviewed and stable, Patient moving all extremities X 4 and Patient able to stick tongue midline  Post vital signs: Reviewed and stable  Complications: No apparent anesthesia complications

## 2012-01-26 LAB — BASIC METABOLIC PANEL
BUN: 9 mg/dL (ref 6–23)
Calcium: 8.5 mg/dL (ref 8.4–10.5)
Creatinine, Ser: 0.61 mg/dL (ref 0.50–1.10)
GFR calc Af Amer: >90 mL/min (ref >90)
GFR calc non Af Amer: >90 mL/min (ref >90)

## 2012-01-26 MED ORDER — SYNTHROID 88 MCG PO TABS: 88.0000 ug | ORAL_TABLET | Freq: Every day | ORAL | Status: DC

## 2012-01-26 MED ORDER — HYDROCODONE-ACETAMINOPHEN 5-325 MG PO TABS
1.0000 | ORAL_TABLET | ORAL | Status: AC | PRN
Start: 2012-01-26 — End: 2012-02-05

## 2012-01-26 NOTE — Progress Notes (Signed)
Vital signs are stable, discharge insturctions reviewed with patient and patients sister, prescriptions given and questions and concerns answered, pt to follow up with [facility] Means, Jany Buckwalter N 11:06am 01-26-12

## 2012-01-26 NOTE — Discharge Summary (Signed)
  Physician Discharge Summary [facility]  Patient ID: Ashley Wise MRN: [id number] DOB/AGE: 03/18/1941 71 y.o.  Admit date: 01/25/2012 Discharge date: 01/26/2012  Admission Diagnoses:  Multinodular goiter with compressive symptoms  Discharge Diagnoses:  Active Problems:  * No active hospital problems. *    Discharged Condition: good  Hospital Course: Patient admitted after thyroidectomy.  Mild nausea and emesis.  Calcium level stable.  Prepared for discharge on post op day #1.  Consults: None  Significant Diagnostic Studies: labs  Treatments: total thyroidectomy  Discharge Exam: Blood pressure 145/62, pulse 79, temperature 99.2 F (37.3 C), temperature source Oral, resp. rate 18, height 5\' 5"  (1.651 m), weight 142 lb 3.2 oz (64.5 kg), SpO2 97.00%. HEENT - clear Neck - wound clear and dry; min STS; voice normal Chest - clear  Disposition: Home with family   Medication List  As of 01/26/2012 10:13 AM   ASK your doctor about these medications         B-complex with vitamin C tablet   Take 1 tablet by mouth daily.      fish oil-omega-3 fatty acids 1000 MG capsule   Take by mouth daily.      Fish Oil 1200 MG Caps   Take 1,200 mg by mouth daily.      Vitamin D 2000 UNITS Caps   Take 1 capsule by mouth daily.           Velora Heckler, MD, FACS General & Endocrine Surgery [facility]   Signed: Velora Heckler 01/26/2012, 10:13 AM

## 2012-01-27 ENCOUNTER — Encounter (HOSPITAL_COMMUNITY): Payer: Self-pay | Admitting: Surgery

## 2012-01-27 ENCOUNTER — Telehealth (INDEPENDENT_AMBULATORY_CARE_PROVIDER_SITE_OTHER): Payer: Self-pay

## 2012-01-27 NOTE — Progress Notes (Signed)
Quick Note:  Please contact patient with benign path results. TMG ______ 

## 2012-01-27 NOTE — Progress Notes (Signed)
Path reported to patient

## 2012-01-27 NOTE — Telephone Encounter (Signed)
Patient aware of post op appointment and lab to be drawn prior- information mailed.

## 2012-02-17 ENCOUNTER — Encounter (INDEPENDENT_AMBULATORY_CARE_PROVIDER_SITE_OTHER): Payer: Self-pay | Admitting: Surgery

## 2012-02-17 ENCOUNTER — Ambulatory Visit (INDEPENDENT_AMBULATORY_CARE_PROVIDER_SITE_OTHER): Payer: Medicare Other | Admitting: Surgery

## 2012-02-17 VITALS — BP 118/66 | HR 64 | Temp 97.8°F | Resp 16 | Ht 65.0 in | Wt 127.4 lb

## 2012-02-17 DIAGNOSIS — E0520 Thyrotoxicosis with toxic multinodular goiter without thyrotoxic crisis or storm: Secondary | ICD-10-CM

## 2012-02-17 NOTE — Progress Notes (Signed)
Visit Diagnoses: 1. Goiter, toxic, multinodular     HISTORY: Patient returns for her first postoperative visit having undergone total thyroidectomy January 25, 2012. Final pathology is benign. Postoperative serum calcium level on February 12, 2012 is normal at 9.2. Patient is taking Synthroid 88 mcg daily.  EXAM: Surgical wound is healing nicely. Remaining Steri-Strips are removed in the office today. Mild soft tissue swelling. No sign of seroma. No sign of infection. Voice quality is normal.  IMPRESSION: Status post total thyroidectomy for multinodular goiter  PLAN: The patient will remain on Synthroid 88 mcg daily. We will check a TSH level in 4 weeks. Patient will begin applying topical creams to her incision. Patient will discontinue calcium supplements. Patient will return to see me in the office in 6 weeks.  Velora Heckler, MD, FACS General & Endocrine Surgery [facility]

## 2012-02-17 NOTE — Patient Instructions (Signed)
  COCOA BUTTER & VITAMIN E CREAM  ([employer] or other brand)  Apply cocoa butter/vitamin E cream to your incision 2 - 3 times daily.  Massage cream into incision for one minute with each application.  Use sunscreen (50 SPF or higher) for first 6 months after surgery if area is exposed to sun.  You may substitute Mederma or other scar reducing creams as desired.   

## 2012-02-19 ENCOUNTER — Encounter: Payer: Self-pay | Admitting: Obstetrics & Gynecology

## 2012-02-19 NOTE — Progress Notes (Signed)
Thanks for taking care of Ashley Wise.  Aizen Duval H

## 2012-03-17 ENCOUNTER — Other Ambulatory Visit (INDEPENDENT_AMBULATORY_CARE_PROVIDER_SITE_OTHER): Payer: Self-pay | Admitting: Surgery

## 2012-03-18 LAB — TSH: TSH: 0.321 u[IU]/mL — ABNORMAL LOW (ref 0.350–4.500)

## 2012-03-22 ENCOUNTER — Ambulatory Visit (INDEPENDENT_AMBULATORY_CARE_PROVIDER_SITE_OTHER): Payer: Medicare Other | Admitting: Surgery

## 2012-03-22 ENCOUNTER — Encounter (INDEPENDENT_AMBULATORY_CARE_PROVIDER_SITE_OTHER): Payer: Self-pay | Admitting: Surgery

## 2012-03-22 VITALS — BP 132/72 | HR 68 | Temp 96.9°F | Resp 18 | Ht 63.0 in | Wt 126.2 lb

## 2012-03-22 DIAGNOSIS — E0520 Thyrotoxicosis with toxic multinodular goiter without thyrotoxic crisis or storm: Secondary | ICD-10-CM

## 2012-03-22 NOTE — Patient Instructions (Signed)
  COCOA BUTTER & VITAMIN E CREAM  ([employer] or other brand)  Apply cocoa butter/vitamin E cream to your incision 2 - 3 times daily.  Massage cream into incision for one minute with each application.  Use sunscreen (50 SPF or higher) for first 6 months after surgery if area is exposed to sun.  You may substitute Mederma or other scar reducing creams as desired.   

## 2012-03-22 NOTE — Progress Notes (Signed)
Visit Diagnoses: 1. Goiter, toxic, multinodular     HISTORY: The patient returns for second postoperative visit having undergone total thyroidectomy for benign disease. Postoperatively she has been taking Synthroid 88 mcg daily. Followup TSH level dated March 17, 2012 and is slightly low at 0.321.  Otherwise patient has no specific complaints. She is doing well since her surgery.  EXAM: The surgical wound is well healed. No significant soft tissue swelling. No palpable masses.  Voice quality is normal.  IMPRESSION: Multinodular thyroid goiter, status post total thyroidectomy  PLAN: I reviewed the laboratory work with the patient today. Her calcium level is normal at 9.2. However her thyroid dosage is probably slightly high. Therefore I have decreased her Synthroid to 75 mcg daily. I have asked her to see Dr. Ouida Sills and 6-8 weeks for a repeat TSH level. My goal would be to keep her TSH between 1.0 and 2.0 long term. I have given her a new prescription for Synthroid today to take to her pharmacy of choice.  Patient will continue to apply topical creams to her incision. She will return to see me in the office as needed.  Velora Heckler, MD, FACS General & Endocrine Surgery [facility]

## 2012-04-21 ENCOUNTER — Encounter (INDEPENDENT_AMBULATORY_CARE_PROVIDER_SITE_OTHER): Payer: Self-pay

## 2012-09-05 ENCOUNTER — Encounter: Payer: Self-pay | Admitting: Internal Medicine

## 2012-10-31 ENCOUNTER — Other Ambulatory Visit (HOSPITAL_COMMUNITY): Payer: Self-pay | Admitting: Internal Medicine

## 2012-10-31 DIAGNOSIS — Z139 Encounter for screening, unspecified: Secondary | ICD-10-CM

## 2012-11-29 ENCOUNTER — Ambulatory Visit (HOSPITAL_COMMUNITY): Payer: Medicare Other

## 2012-12-01 ENCOUNTER — Ambulatory Visit (HOSPITAL_COMMUNITY)
Admission: RE | Admit: 2012-12-01 | Discharge: 2012-12-01 | Disposition: A | Discharge status: Home / Self Care | Payer: Medicare Other | Source: Ambulatory Visit | Attending: Internal Medicine | Admitting: Internal Medicine

## 2012-12-01 DIAGNOSIS — Z1231 Encounter for screening mammogram for malignant neoplasm of breast: Secondary | ICD-10-CM | POA: Insufficient documentation

## 2012-12-01 DIAGNOSIS — Z139 Encounter for screening, unspecified: Secondary | ICD-10-CM

## 2013-01-01 IMAGING — CR DG CHEST 2V
3 series · 3 of 3 positions shown · non-contrast
Comparison: Thyroid ultrasound 09/18/2011

CLINICAL DATA: Evaluation for thyroid goiter.  No current chest
complaints.  Nonsmoker

CHEST - 2 VIEW

[w chest pa]
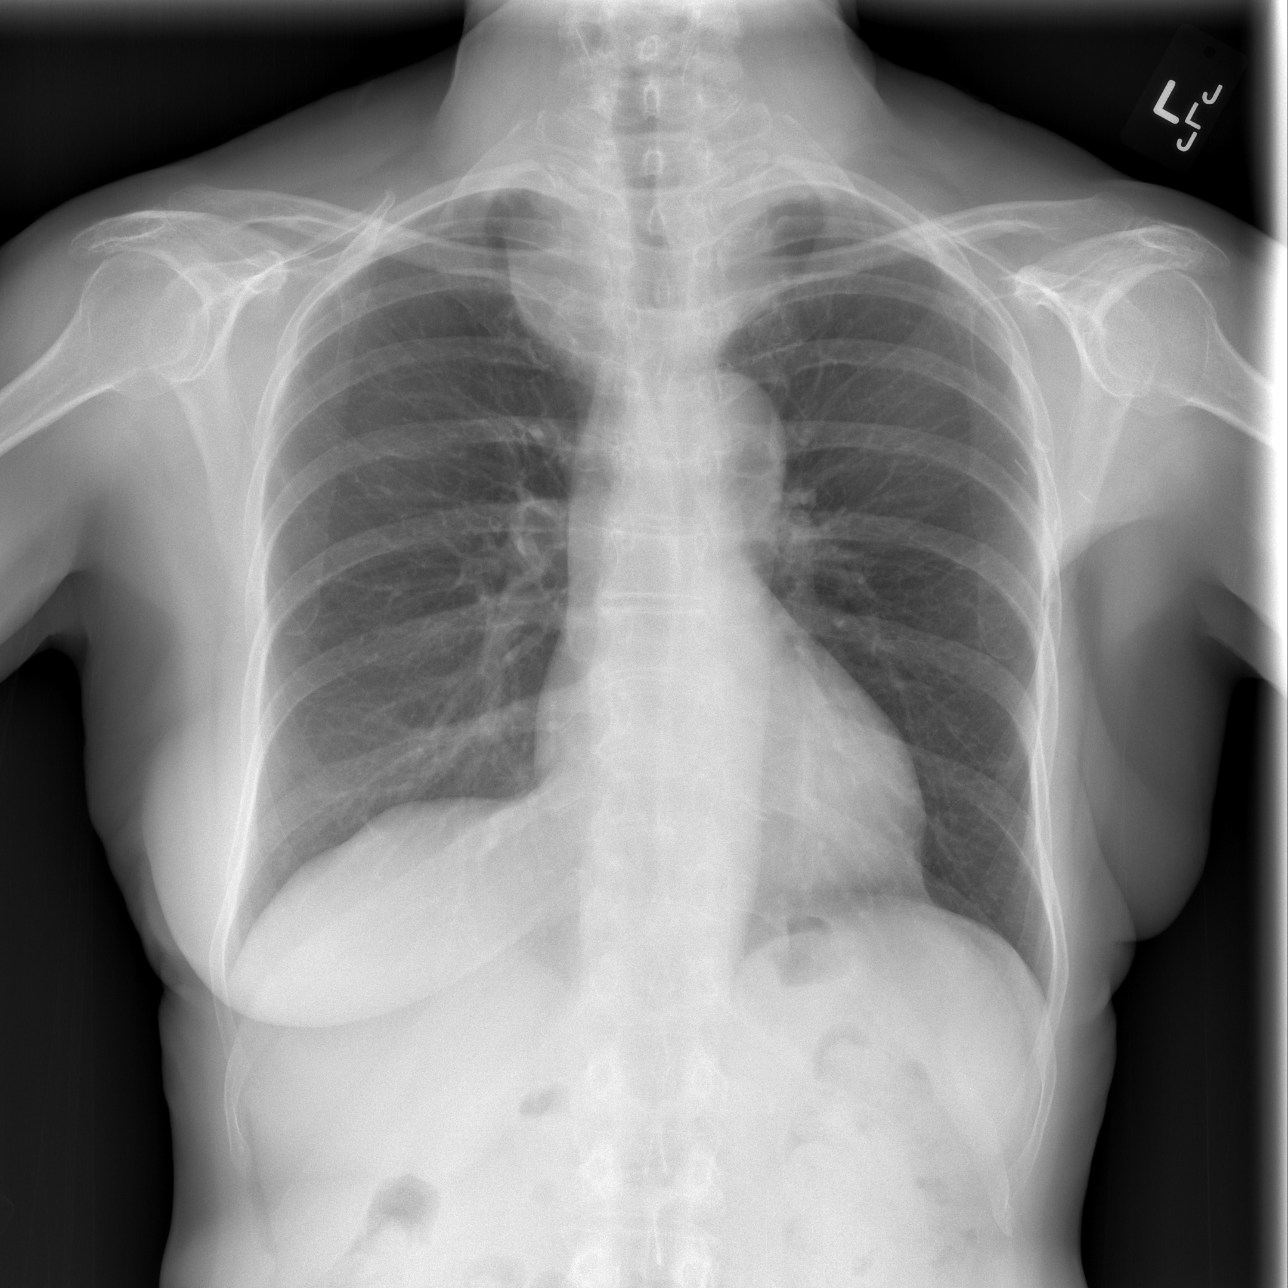

[w chest lat (1 of 2)]
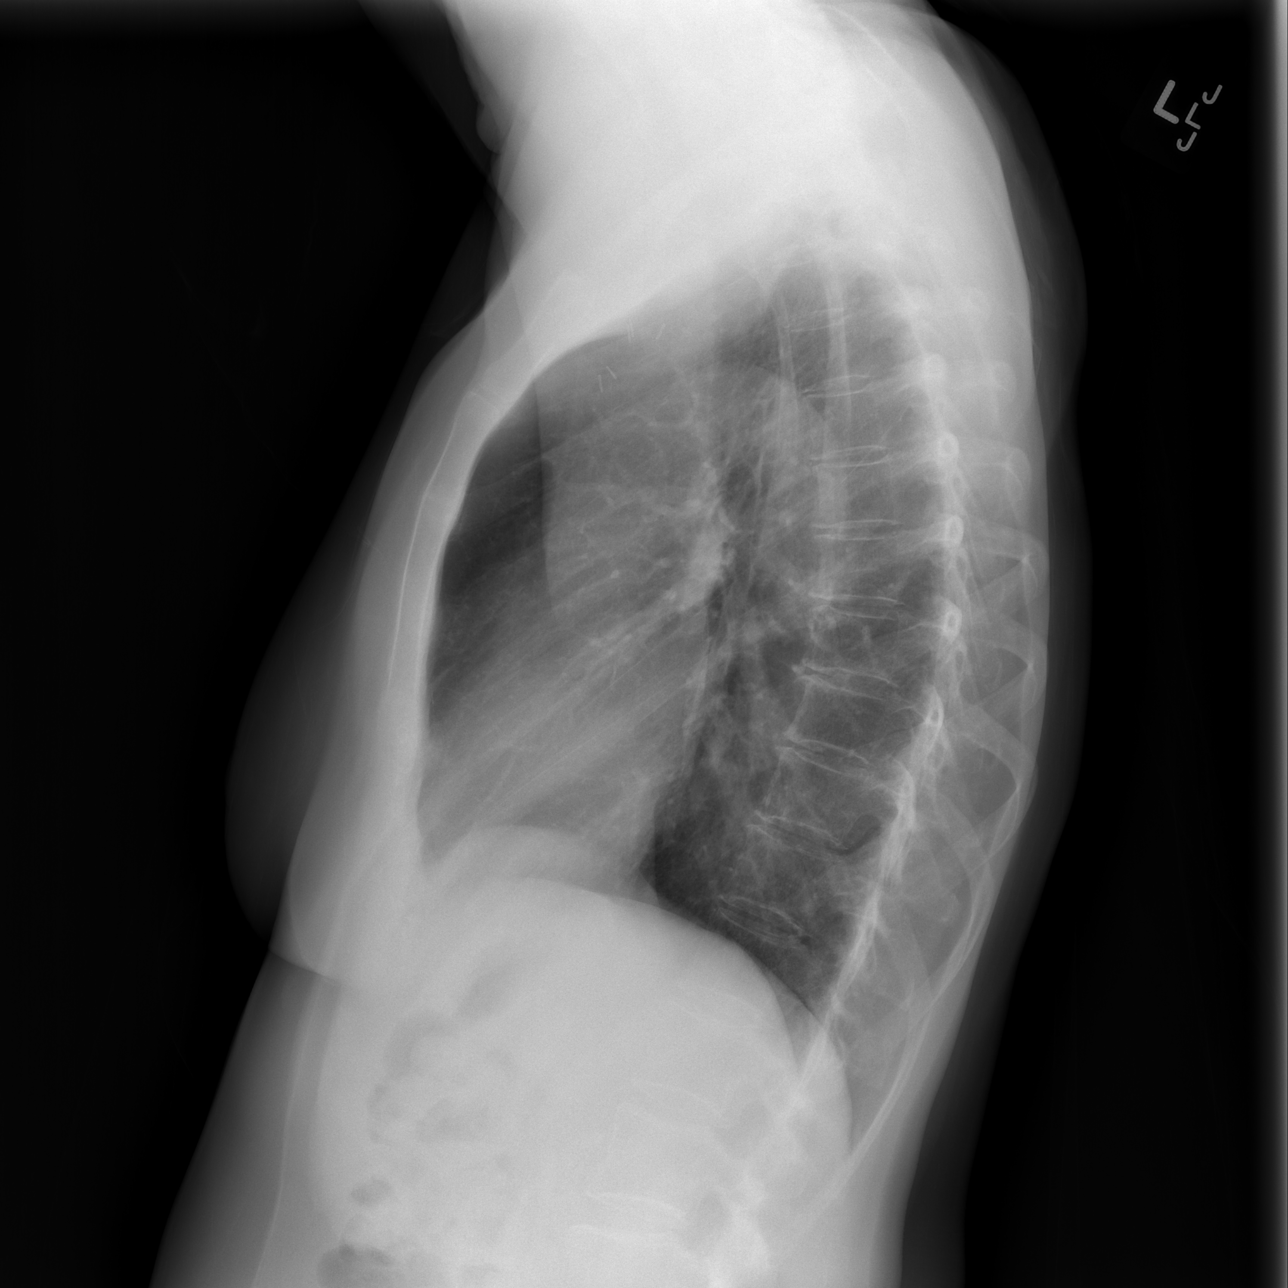

[w chest lat (2 of 2)]
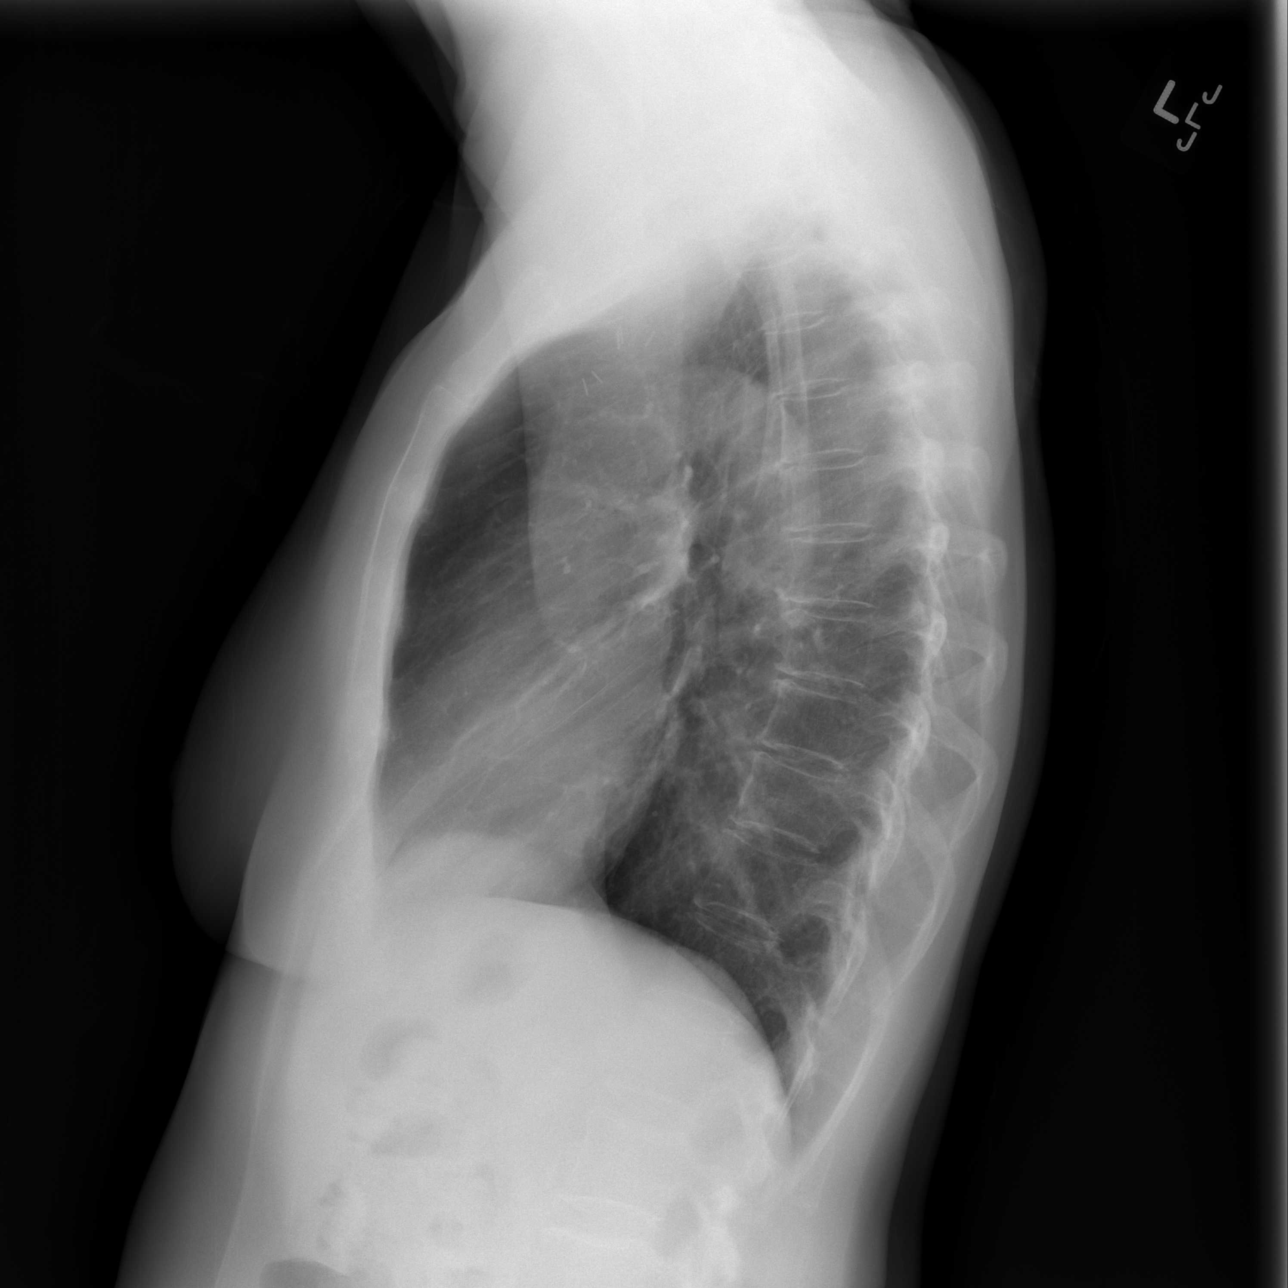

[3 of 3 positions shown; findings below may reference images not displayed]

FINDINGS: The patient is status post left mastectomy and surgical
clips are identified in the left axillary region.  A superior
mediastinal soft tissue mass is identified on both sides of the
tracheal air column compatible with the patient's known goiter and
suggesting a substernal component.

Heart and mediastinal contours are within normal limits otherwise.
The lung fields appear clear with no signs of focal infiltrate or
congestive failure.  No pleural fluid or significant peribronchial
cuffing is seen.  Mild bilateral apical pleural thickening is
evident.

Bony structures demonstrate mild thoracic osteophytosis and are
otherwise intact.
IMPRESSION: Superior mediastinal soft tissue mass correlate with the patient's
known thyroid goiter and compatible with a substernal component.

Clear lungs.

## 2013-05-23 ENCOUNTER — Encounter: Payer: Self-pay | Admitting: *Deleted

## 2013-05-24 ENCOUNTER — Encounter: Payer: Self-pay | Admitting: Obstetrics & Gynecology

## 2013-05-24 ENCOUNTER — Ambulatory Visit (INDEPENDENT_AMBULATORY_CARE_PROVIDER_SITE_OTHER): Payer: Self-pay | Admitting: Obstetrics & Gynecology

## 2013-05-24 VITALS — BP 160/80 | Wt 133.0 lb

## 2013-05-24 DIAGNOSIS — N813 Complete uterovaginal prolapse: Secondary | ICD-10-CM

## 2013-05-24 NOTE — Progress Notes (Signed)
Patient ID: Ashley Wise, female   DOB: 1941-01-28, 72 y.o.   MRN: [id number]      Ashley Wise presents today for routine follow up related to her pessary.   She uses a Milex ring with support #5.  She reports no vaginal discharge or vaginal bleeding.  Exam reveals no undue vaginal mucosal pressure of breakdown, minimal discharge and no vaginal bleeding.  The pessary is removed, cleaned and replaced without difficulty.    Ashley Wise will be sen back in 4 months for continued follow up.  EURE,LUTHER H 05/24/2013 2:19 PM

## 2013-09-12 ENCOUNTER — Encounter: Payer: Self-pay | Admitting: Obstetrics & Gynecology

## 2013-09-12 ENCOUNTER — Ambulatory Visit (INDEPENDENT_AMBULATORY_CARE_PROVIDER_SITE_OTHER): Payer: Medicare Other | Admitting: Obstetrics & Gynecology

## 2013-09-12 VITALS — BP 162/70 | Ht 63.0 in | Wt 132.0 lb

## 2013-09-12 DIAGNOSIS — N993 Prolapse of vaginal vault after hysterectomy: Secondary | ICD-10-CM

## 2013-09-12 DIAGNOSIS — E0520 Thyrotoxicosis with toxic multinodular goiter without thyrotoxic crisis or storm: Secondary | ICD-10-CM

## 2013-09-12 NOTE — Progress Notes (Signed)
Patient ID: Ashley Wise, female   DOB: 1941-04-24, 72 y.o.   MRN: [id number] Patient ID: Ashley Wise, female   DOB: 1941-02-24, 72 y.o.   MRN: [id number]      Ashley Wise presents today for routine follow up related to her pessary.   She uses a Milex ring with support #5.  She reports no vaginal discharge or vaginal bleeding.  Exam reveals no undue vaginal mucosal pressure of breakdown, minimal discharge and no vaginal bleeding.  The pessary is removed, cleaned and replaced without difficulty.    Ashley Wise will be sen back in 4 months for continued follow up.  Ashley Wise 09/12/2013 4:24 PM

## 2013-11-08 ENCOUNTER — Other Ambulatory Visit (HOSPITAL_COMMUNITY): Payer: Self-pay | Admitting: Internal Medicine

## 2013-11-08 DIAGNOSIS — Z139 Encounter for screening, unspecified: Secondary | ICD-10-CM

## 2013-12-04 ENCOUNTER — Ambulatory Visit (HOSPITAL_COMMUNITY)
Admission: RE | Admit: 2013-12-04 | Discharge: 2013-12-04 | Disposition: A | Discharge status: Home / Self Care | Payer: Medicare Other | Source: Ambulatory Visit | Attending: Internal Medicine | Admitting: Internal Medicine

## 2013-12-04 DIAGNOSIS — Z139 Encounter for screening, unspecified: Secondary | ICD-10-CM

## 2013-12-04 DIAGNOSIS — Z1231 Encounter for screening mammogram for malignant neoplasm of breast: Secondary | ICD-10-CM | POA: Insufficient documentation

## 2014-01-11 ENCOUNTER — Encounter: Payer: Self-pay | Admitting: Obstetrics & Gynecology

## 2014-01-11 ENCOUNTER — Ambulatory Visit (INDEPENDENT_AMBULATORY_CARE_PROVIDER_SITE_OTHER): Payer: Medicare Other | Admitting: Obstetrics & Gynecology

## 2014-01-11 VITALS — BP 150/70 | Wt 133.4 lb

## 2014-01-11 DIAGNOSIS — N993 Prolapse of vaginal vault after hysterectomy: Secondary | ICD-10-CM

## 2014-01-11 NOTE — Progress Notes (Signed)
Patient ID: EUNICE WINECOFF, female   DOB: March 23, 1941, 73 y.o.   MRN: [id number] Patient ID: PAYTYN MESTA, female   DOB: [birth year], 73 y.o.   MRN: [id number] Patient ID: DARLEEN MOFFITT, female   DOB: 26-Oct-1941, 73 y.o.   MRN: [id number]      Ashley Wise presents today for routine follow up related to her pessary.   She uses a Milex ring with support #5.  She reports no vaginal discharge or vaginal bleeding.  Exam reveals no undue vaginal mucosal pressure of breakdown, minimal discharge and no vaginal bleeding.  The pessary is removed, cleaned and replaced without difficulty.    Ashley Wise will be sen back in 4 months for continued follow up.  Florian Buff 01/11/2014 2:32 PM

## 2014-01-12 ENCOUNTER — Encounter: Payer: Medicare Other | Admitting: Obstetrics & Gynecology

## 2014-05-11 ENCOUNTER — Ambulatory Visit (INDEPENDENT_AMBULATORY_CARE_PROVIDER_SITE_OTHER): Payer: Medicare Other | Admitting: Obstetrics & Gynecology

## 2014-05-11 ENCOUNTER — Encounter: Payer: Self-pay | Admitting: Obstetrics & Gynecology

## 2014-05-11 VITALS — BP 130/80 | Wt 130.0 lb

## 2014-05-11 DIAGNOSIS — N993 Prolapse of vaginal vault after hysterectomy: Secondary | ICD-10-CM

## 2014-05-11 NOTE — Progress Notes (Signed)
Patient ID: ANNAMAY LAYMON, female   DOB: [birth year], 73 y.o.   MRN: [id number] Patient ID: AVAREE GILBERTI, female   DOB: 1941/12/16, 73 y.o.   MRN: [id number] Patient ID: BRITLYN MARTINE, female   DOB: 14-Oct-1941, 72 y.o.   MRN: [id number] Patient ID: JASLYNNE DAHAN, female   DOB: [birth year], 73 y.o.   MRN: [id number]  OKLA QAZI presents today for routine follow up related to her pessary.   She uses a Milex ring with support #5.  She reports no vaginal discharge or vaginal bleeding.  Exam reveals no undue vaginal mucosal pressure of breakdown, minimal discharge and no vaginal bleeding.  The pessary is removed, cleaned and replaced without difficulty.    ZIONAH CRISWELL will be sen back in 4 months for continued follow up.  Florian Buff 05/11/2014 11:18 AM   Review of Systems  Constitutional: Negative for fever, chills, weight loss, malaise/fatigue and diaphoresis.  HENT: Negative for hearing loss, ear pain, nosebleeds, congestion, sore throat, neck pain, tinnitus and ear discharge.   Eyes: Negative for blurred vision, double vision, photophobia, pain, discharge and redness.  Respiratory: Negative for cough, hemoptysis, sputum production, shortness of breath, wheezing and stridor.   Cardiovascular: Negative for chest pain, palpitations, orthopnea, claudication, leg swelling and PND.  Gastrointestinal: Negative for abdominal pain. Negative for heartburn, nausea, vomiting, diarrhea, constipation, blood in stool and melena.  Genitourinary: Negative for dysuria, urgency, frequency, hematuria and flank pain.  Musculoskeletal: Negative for myalgias, back pain, joint pain and falls.  Skin: Negative for itching and rash.  Neurological: Negative for dizziness, tingling, tremors, sensory change, speech change, focal weakness, seizures, loss of consciousness, weakness and headaches.  Endo/Heme/Allergies: Negative for environmental allergies and polydipsia. Does not  bruise/bleed easily.  Psychiatric/Behavioral: Negative for depression, suicidal ideas, hallucinations, memory loss and substance abuse. The patient is not nervous/anxious and does not have insomnia.

## 2014-09-11 ENCOUNTER — Ambulatory Visit (INDEPENDENT_AMBULATORY_CARE_PROVIDER_SITE_OTHER): Payer: Medicare Other | Admitting: Obstetrics & Gynecology

## 2014-09-11 ENCOUNTER — Encounter: Payer: Self-pay | Admitting: Obstetrics & Gynecology

## 2014-09-11 VITALS — BP 160/80 | Wt 131.4 lb

## 2014-09-11 DIAGNOSIS — N993 Prolapse of vaginal vault after hysterectomy: Secondary | ICD-10-CM

## 2014-09-11 NOTE — Progress Notes (Signed)
Patient ID: Ashley Wise, female   DOB: 15-May-1941, 73 y.o.   MRN: [id number] Ashley Wise presents today for routine follow up related to her pessary.  She uses a Milex ring with support #5.  She reports no vaginal discharge or vaginal bleeding.  Exam reveals no undue vaginal mucosal pressure of breakdown, minimal discharge and no vaginal bleeding.  The pessary is removed, cleaned and replaced without difficulty.  Ashley Wise will be sen back in 4 months for continued follow up.     Review of Systems  Constitutional: Negative for fever, chills, weight loss, malaise/fatigue and diaphoresis.  HENT: Negative for hearing loss, ear pain, nosebleeds, congestion, sore throat, neck pain, tinnitus and ear discharge.   Eyes: Negative for blurred vision, double vision, photophobia, pain, discharge and redness.  Respiratory: Negative for cough, hemoptysis, sputum production, shortness of breath, wheezing and stridor.   Cardiovascular: Negative for chest pain, palpitations, orthopnea, claudication, leg swelling and PND.  Gastrointestinal: Positive for abdominal pain. Negative for heartburn, nausea, vomiting, diarrhea, constipation, blood in stool and melena.  Genitourinary: Negative for dysuria, urgency, frequency, hematuria and flank pain.  Musculoskeletal: Negative for myalgias, back pain, joint pain and falls.  Skin: Negative for itching and rash.  Neurological: Negative for dizziness, tingling, tremors, sensory change, speech change, focal weakness, seizures, loss of consciousness, weakness and headaches.  Endo/Heme/Allergies: Negative for environmental allergies and polydipsia. Does not bruise/bleed easily.  Psychiatric/Behavioral: Negative for depression, suicidal ideas, hallucinations, memory loss and substance abuse. The patient is not nervous/anxious and does not have insomnia.

## 2014-10-29 ENCOUNTER — Encounter: Payer: Self-pay | Admitting: Obstetrics & Gynecology

## 2014-12-10 ENCOUNTER — Other Ambulatory Visit (HOSPITAL_COMMUNITY): Payer: Self-pay | Admitting: Internal Medicine

## 2014-12-10 DIAGNOSIS — Z139 Encounter for screening, unspecified: Secondary | ICD-10-CM

## 2014-12-19 ENCOUNTER — Ambulatory Visit (HOSPITAL_COMMUNITY)
Admission: RE | Admit: 2014-12-19 | Discharge: 2014-12-19 | Disposition: A | Discharge status: Home / Self Care | Payer: Medicare Other | Source: Ambulatory Visit | Attending: Internal Medicine | Admitting: Internal Medicine

## 2014-12-19 DIAGNOSIS — Z1231 Encounter for screening mammogram for malignant neoplasm of breast: Secondary | ICD-10-CM | POA: Diagnosis present

## 2014-12-19 DIAGNOSIS — Z139 Encounter for screening, unspecified: Secondary | ICD-10-CM

## 2015-01-11 ENCOUNTER — Ambulatory Visit (INDEPENDENT_AMBULATORY_CARE_PROVIDER_SITE_OTHER): Payer: Medicare Other | Admitting: Obstetrics & Gynecology

## 2015-01-11 ENCOUNTER — Encounter: Payer: Self-pay | Admitting: Obstetrics & Gynecology

## 2015-01-11 VITALS — BP 170/90 | Wt 135.0 lb

## 2015-01-11 DIAGNOSIS — N993 Prolapse of vaginal vault after hysterectomy: Secondary | ICD-10-CM | POA: Diagnosis not present

## 2015-01-11 NOTE — Progress Notes (Signed)
Patient ID: Ashley Wise, female   DOB: September 25, 1941, 74 y.o.   MRN: [id number]      MANON BANBURY presents today for routine follow up related to her pessary.   She uses a milex ring with support #4 She reports no vaginal discharge or vaginal bleeding.  Exam reveals no undue vaginal mucosal pressure of breakdown, no discharge and no vaginal bleeding.  The pessary is removed, cleaned and replaced without difficulty.    KAYLAMARIE SWICKARD will be sen back in 4 months for continued follow up.  Kellin Bartling H 01/11/2015 10:03 AM

## 2015-05-13 ENCOUNTER — Ambulatory Visit (INDEPENDENT_AMBULATORY_CARE_PROVIDER_SITE_OTHER): Payer: Medicare Other | Admitting: Obstetrics & Gynecology

## 2015-05-13 ENCOUNTER — Encounter: Payer: Self-pay | Admitting: Obstetrics & Gynecology

## 2015-05-13 VITALS — BP 140/70 | HR 72 | Wt 135.0 lb

## 2015-05-13 DIAGNOSIS — N993 Prolapse of vaginal vault after hysterectomy: Secondary | ICD-10-CM

## 2015-05-13 NOTE — Progress Notes (Signed)
Patient ID: Ashley Wise, female   DOB: December 24, 1941, 74 y.o.   MRN: [id number]  Chief Complaint  Patient presents with  . gyn visit    clean pessary.       Ashley Wise presents today for routine follow up related to her pessary.   She uses a Milex ring #4 She reports no vaginal discharge or vaginal bleeding.  Exam reveals no undue vaginal mucosal pressure of breakdown, no discharge and no vaginal bleeding.  The pessary is removed, cleaned and replaced without difficulty.    Ashley Wise will be sen back in 4 months for continued follow up.  EURE,LUTHER H 05/13/2015 9:28 AM

## 2015-09-13 ENCOUNTER — Encounter: Payer: Self-pay | Admitting: Obstetrics & Gynecology

## 2015-09-13 ENCOUNTER — Ambulatory Visit (INDEPENDENT_AMBULATORY_CARE_PROVIDER_SITE_OTHER): Payer: Medicare Other | Admitting: Obstetrics & Gynecology

## 2015-09-13 VITALS — BP 130/72 | Ht 63.0 in | Wt 134.0 lb

## 2015-09-13 DIAGNOSIS — N993 Prolapse of vaginal vault after hysterectomy: Secondary | ICD-10-CM | POA: Diagnosis not present

## 2015-09-13 NOTE — Progress Notes (Signed)
Patient ID: Ashley Wise, female   DOB: [birth year], 74 y.o.   MRN: [id number] Chief Complaint  Patient presents with  . Pessary Check    Clean pessary    Blood pressure 130/72, height 5\' 3"  (1.6 m), weight 134 lb (60.782 kg).  Ashley Wise presents today for routine follow up related to her pessary.   She uses a milex ring with support #4 She reports no vaginal discharge or vaginal bleeding.  Exam reveals no undue vaginal mucosal pressure of breakdown, no discharge and no vaginal bleeding.  The pessary is removed, cleaned and replaced without difficulty.    Ashley Wise will be sen back in 4 months for continued follow up.  Florian Buff, MD 09/13/2015 10:29 AM

## 2015-10-02 DIAGNOSIS — E039 Hypothyroidism, unspecified: Secondary | ICD-10-CM | POA: Diagnosis not present

## 2015-10-02 DIAGNOSIS — C50519 Malignant neoplasm of lower-outer quadrant of unspecified female breast: Secondary | ICD-10-CM | POA: Diagnosis not present

## 2015-10-02 DIAGNOSIS — Z79899 Other long term (current) drug therapy: Secondary | ICD-10-CM | POA: Diagnosis not present

## 2015-10-10 DIAGNOSIS — Z6823 Body mass index (BMI) 23.0-23.9, adult: Secondary | ICD-10-CM | POA: Diagnosis not present

## 2015-10-10 DIAGNOSIS — Z23 Encounter for immunization: Secondary | ICD-10-CM | POA: Diagnosis not present

## 2015-10-10 DIAGNOSIS — E039 Hypothyroidism, unspecified: Secondary | ICD-10-CM | POA: Diagnosis not present

## 2015-10-10 DIAGNOSIS — C50112 Malignant neoplasm of central portion of left female breast: Secondary | ICD-10-CM | POA: Diagnosis not present

## 2015-12-04 ENCOUNTER — Other Ambulatory Visit (HOSPITAL_COMMUNITY): Payer: Self-pay | Admitting: Internal Medicine

## 2015-12-04 DIAGNOSIS — Z1231 Encounter for screening mammogram for malignant neoplasm of breast: Secondary | ICD-10-CM

## 2015-12-27 ENCOUNTER — Ambulatory Visit (HOSPITAL_COMMUNITY)
Admission: RE | Admit: 2015-12-27 | Discharge: 2015-12-27 | Disposition: A | Discharge status: Home / Self Care | Payer: Medicare Other | Source: Ambulatory Visit | Attending: Internal Medicine | Admitting: Internal Medicine

## 2015-12-27 DIAGNOSIS — Z1231 Encounter for screening mammogram for malignant neoplasm of breast: Secondary | ICD-10-CM

## 2016-01-13 ENCOUNTER — Ambulatory Visit (INDEPENDENT_AMBULATORY_CARE_PROVIDER_SITE_OTHER): Payer: Medicare Other | Admitting: Obstetrics & Gynecology

## 2016-01-13 ENCOUNTER — Encounter: Payer: Self-pay | Admitting: Obstetrics & Gynecology

## 2016-01-13 VITALS — BP 158/68 | HR 72 | Ht 63.0 in | Wt 137.0 lb

## 2016-01-13 DIAGNOSIS — N993 Prolapse of vaginal vault after hysterectomy: Secondary | ICD-10-CM | POA: Diagnosis not present

## 2016-01-13 NOTE — Progress Notes (Signed)
Patient ID: [name], female   DOB: Jun 09, 1941, 75 y.o.   MRN: [id number] Chief Complaint  Patient presents with  . Pessary Check    Blood pressure 158/68, pulse 72, height 5\' 3"  (1.6 m), weight 137 lb (62.143 kg).  Ashley Wise presents today for routine follow up related to her pessary.   She uses a milex ring with support #5 She reports no vaginal discharge or vaginal bleeding.  Exam reveals no undue vaginal mucosal pressure of breakdown, no discharge and no vaginal bleeding.  The pessary is removed, cleaned and replaced without difficulty.    LAURELL SINGLETARY will be sen back in 4 months for continued follow up.  Florian Buff, MD   01/13/2016 4:30 PM

## 2016-04-14 DIAGNOSIS — J309 Allergic rhinitis, unspecified: Secondary | ICD-10-CM | POA: Diagnosis not present

## 2016-04-14 DIAGNOSIS — Z6824 Body mass index (BMI) 24.0-24.9, adult: Secondary | ICD-10-CM | POA: Diagnosis not present

## 2016-05-12 ENCOUNTER — Encounter: Payer: Self-pay | Admitting: Obstetrics & Gynecology

## 2016-05-12 ENCOUNTER — Ambulatory Visit (INDEPENDENT_AMBULATORY_CARE_PROVIDER_SITE_OTHER): Payer: Medicare Other | Admitting: Obstetrics & Gynecology

## 2016-05-12 VITALS — BP 140/80 | HR 66 | Ht 63.0 in | Wt 138.0 lb

## 2016-05-12 DIAGNOSIS — N993 Prolapse of vaginal vault after hysterectomy: Secondary | ICD-10-CM

## 2016-05-12 NOTE — Progress Notes (Signed)
Patient ID: Ashley Wise, female   DOB: July 05, 1941, 75 y.o.   MRN: [id number] Chief Complaint  Patient presents with  . 4 month follow-up    check pessary/ clean    Blood pressure 140/80, pulse 66, height 5\' 3"  (1.6 m), weight 138 lb (62.596 kg).  Ashley Wise presents today for routine follow up related to her pessary.   She uses a milex ring with support #4 She reports no vaginal discharge or vaginal bleeding.  Exam reveals no undue vaginal mucosal pressure of breakdown, no discharge and no vaginal bleeding.  The pessary is removed, cleaned and replaced without difficulty.    Ashley Wise will be sen back in 4 months for continued follow up.  Pt is also complaining of a cramp like pain in the lateral right lower leg which progresses to a "falling asleep" feeling. No pain in back or down back of leg  Recommend chiropractic care to start with, most likely a nerve begin compressed, neurogenic pain  Florian Buff, MD  05/12/2016 9:40 AM

## 2016-06-02 DIAGNOSIS — M7130 Other bursal cyst, unspecified site: Secondary | ICD-10-CM | POA: Diagnosis not present

## 2016-08-04 DIAGNOSIS — K1121 Acute sialoadenitis: Secondary | ICD-10-CM | POA: Diagnosis not present

## 2016-09-14 ENCOUNTER — Ambulatory Visit (INDEPENDENT_AMBULATORY_CARE_PROVIDER_SITE_OTHER): Payer: Medicare Other | Admitting: Obstetrics & Gynecology

## 2016-09-14 ENCOUNTER — Encounter: Payer: Self-pay | Admitting: Obstetrics & Gynecology

## 2016-09-14 VITALS — BP 120/70 | HR 74 | Ht 63.0 in | Wt 138.3 lb

## 2016-09-14 DIAGNOSIS — N993 Prolapse of vaginal vault after hysterectomy: Secondary | ICD-10-CM

## 2016-09-14 NOTE — Progress Notes (Signed)
Patient ID: Ashley Wise, female   DOB: 1941/05/14, 75 y.o.   MRN: [id number] Chief Complaint  Patient presents with  . Pessary Check    clean check    Blood pressure 120/70, pulse 74, height 5\' 3"  (1.6 m), weight 138 lb 4.8 oz (62.7 kg).  Ashley Wise presents today for routine follow up related to her pessary.   She uses a milex ring with support #4 She reports no vaginal discharge or vaginal bleeding.  Exam reveals no undue vaginal mucosal pressure of breakdown, no discharge and no vaginal bleeding.  The pessary is removed, cleaned and replaced without difficulty.    Ashley Wise will be sen back in 4 months for continued follow up.  Her right lateral leg pain has resolved  Florian Buff, MD  09/14/2016 9:17 AM

## 2016-10-05 DIAGNOSIS — C50919 Malignant neoplasm of unspecified site of unspecified female breast: Secondary | ICD-10-CM | POA: Diagnosis not present

## 2016-10-05 DIAGNOSIS — E049 Nontoxic goiter, unspecified: Secondary | ICD-10-CM | POA: Diagnosis not present

## 2016-10-05 DIAGNOSIS — Z79899 Other long term (current) drug therapy: Secondary | ICD-10-CM | POA: Diagnosis not present

## 2016-10-12 DIAGNOSIS — R7301 Impaired fasting glucose: Secondary | ICD-10-CM | POA: Diagnosis not present

## 2016-10-12 DIAGNOSIS — E038 Other specified hypothyroidism: Secondary | ICD-10-CM | POA: Diagnosis not present

## 2016-10-12 DIAGNOSIS — Z23 Encounter for immunization: Secondary | ICD-10-CM | POA: Diagnosis not present

## 2016-10-30 DIAGNOSIS — H524 Presbyopia: Secondary | ICD-10-CM | POA: Diagnosis not present

## 2016-10-30 DIAGNOSIS — H5203 Hypermetropia, bilateral: Secondary | ICD-10-CM | POA: Diagnosis not present

## 2016-10-30 DIAGNOSIS — H52223 Regular astigmatism, bilateral: Secondary | ICD-10-CM | POA: Diagnosis not present

## 2017-01-04 DIAGNOSIS — H25812 Combined forms of age-related cataract, left eye: Secondary | ICD-10-CM | POA: Diagnosis not present

## 2017-01-04 DIAGNOSIS — H2511 Age-related nuclear cataract, right eye: Secondary | ICD-10-CM | POA: Diagnosis not present

## 2017-01-04 DIAGNOSIS — H5203 Hypermetropia, bilateral: Secondary | ICD-10-CM | POA: Diagnosis not present

## 2017-01-08 DIAGNOSIS — H25812 Combined forms of age-related cataract, left eye: Secondary | ICD-10-CM | POA: Diagnosis not present

## 2017-01-11 DIAGNOSIS — E039 Hypothyroidism, unspecified: Secondary | ICD-10-CM | POA: Diagnosis not present

## 2017-01-14 ENCOUNTER — Ambulatory Visit: Payer: Medicare Other | Admitting: Obstetrics & Gynecology

## 2017-01-18 ENCOUNTER — Other Ambulatory Visit (HOSPITAL_COMMUNITY): Payer: Self-pay | Admitting: Internal Medicine

## 2017-01-18 DIAGNOSIS — E039 Hypothyroidism, unspecified: Secondary | ICD-10-CM | POA: Diagnosis not present

## 2017-01-18 DIAGNOSIS — Z6824 Body mass index (BMI) 24.0-24.9, adult: Secondary | ICD-10-CM | POA: Diagnosis not present

## 2017-01-18 DIAGNOSIS — Z1231 Encounter for screening mammogram for malignant neoplasm of breast: Secondary | ICD-10-CM

## 2017-01-19 ENCOUNTER — Ambulatory Visit (INDEPENDENT_AMBULATORY_CARE_PROVIDER_SITE_OTHER): Payer: Medicare Other | Admitting: Obstetrics & Gynecology

## 2017-01-19 ENCOUNTER — Encounter: Payer: Self-pay | Admitting: Obstetrics & Gynecology

## 2017-01-19 VITALS — BP 100/60 | HR 60 | Wt 139.0 lb

## 2017-01-19 DIAGNOSIS — N993 Prolapse of vaginal vault after hysterectomy: Secondary | ICD-10-CM | POA: Diagnosis not present

## 2017-01-20 ENCOUNTER — Ambulatory Visit (HOSPITAL_COMMUNITY)
Admission: RE | Admit: 2017-01-20 | Discharge: 2017-01-20 | Disposition: A | Discharge status: Home / Self Care | Payer: Medicare Other | Source: Ambulatory Visit | Attending: Internal Medicine | Admitting: Internal Medicine

## 2017-01-20 DIAGNOSIS — Z1231 Encounter for screening mammogram for malignant neoplasm of breast: Secondary | ICD-10-CM | POA: Diagnosis not present

## 2017-02-01 NOTE — Patient Instructions (Signed)
Ashley Wise  02/01/2017     @PREFPERIOPPHARMACY @   Your procedure is scheduled on 02/08/2017.  Report to [facility] at 7:30 A.M.  Call this number if you have problems the morning of surgery:  [phone]   Remember:  Do not eat food or drink liquids after midnight.  Take these medicines the morning of surgery with A SIP OF WATER Synthroid   Do not wear jewelry, make-up or nail polish.  Do not wear lotions, powders, or perfumes, or deoderant.  Do not shave 48 hours prior to surgery.  Men may shave face and neck.  Do not bring valuables to the hospital.  [facility] is not responsible for any belongings or valuables.  Contacts, dentures or bridgework may not be worn into surgery.  Leave your suitcase in the car.  After surgery it may be brought to your room.  For patients admitted to the hospital, discharge time will be determined by your treatment team.  Patients discharged the day of surgery will not be allowed to drive home.    Please read over the following fact sheets that you were given. Anesthesia Post-op Instructions     PATIENT INSTRUCTIONS POST-ANESTHESIA  IMMEDIATELY FOLLOWING SURGERY:  Do not drive or operate machinery for the first twenty four hours after surgery.  Do not make any important decisions for twenty four hours after surgery or while taking narcotic pain medications or sedatives.  If you develop intractable nausea and vomiting or a severe headache please notify your doctor immediately.  FOLLOW-UP:  Please make an appointment with your surgeon as instructed. You do not need to follow up with anesthesia unless specifically instructed to do so.  WOUND CARE INSTRUCTIONS (if applicable):  Keep a dry clean dressing on the anesthesia/puncture wound site if there is drainage.  Once the wound has quit draining you may leave it open to air.  Generally you should leave the bandage intact for twenty four hours unless there is drainage.  If the epidural  site drains for more than 36-48 hours please call the anesthesia department.  QUESTIONS?:  Please feel free to call your physician or the hospital operator if you have any questions, and they will be happy to assist you.      Cataract Surgery Cataract surgery is a procedure to remove a cataract from your eye. A cataract is cloudiness on the lens of your eye. The lens focuses light inside the eye. When a lens becomes cloudy, your vision is affected. Cataract surgery is a procedure to remove the cloudy lens. A substitute lens (intraocular lens or IOL) is usually inserted as a replacement for the cloudy lens. Tell a health care provider about:  Any allergies you have.  All medicines you are taking, including vitamins, herbs, eye drops, creams, and over-the-counter medicines.  Any problems you or family members have had with anesthetic medicines.  Any blood disorders you have.  Any surgeries you have had, especially eye surgeries that include refractive surgery, such as PRK and LASIK.  Any medical conditions you have.  Whether you are pregnant or may be pregnant. What are the risks? Generally, this is a safe procedure. However, problems may occur, including:  Infection.  Bleeding.  Glaucoma.  Retinal detachment.  Allergic reactions to medicines.  Damage to other structures or organs.  Inflammation of the eye.  Clouding of the part of your eye that holds an IOL in place (after-cataract), if an IOL was inserted. This is fairly common.  An IOL moving out of position, if an IOL was inserted. This is very rare.  Loss of vision. This is rare. What happens before the procedure?  Follow instructions from your health care provider about eating or drinking restrictions.  Ask your health care provider about:  Changing or stopping your regular medicines, including any eye drops you have been prescribed. This is especially important if you are taking diabetes medicines or blood  thinners.  Taking medicines such as aspirin and ibuprofen. These medicines can thin your blood. Do not take these medicines before your procedure if your health care provider instructs you not to.  Do not put contact lenses in either eye on the day of your surgery.  Plan for someone to drive you to and from the procedure.  If you will be going home right after the procedure, plan to have someone with you for 24 hours. What happens during the procedure?  An IV tube may be inserted into one of your veins.  You will be given one or more of the following:  A medicine to help you relax (sedative).  A medicine to numb the area (local anesthetic). This may be numbing eye drops or an injection that is given behind the eye.  A small cut (incision) will be made to the edge of the clear, dome-shaped surface that covers the front of the eye (cornea).  A small probe will be inserted into the eye. This device gives off ultrasound waves that soften and break up the cloudy center of the lens. This makes it easier for the cloudy lens to be removed by suction.  An IOL may be implanted.  Part of the capsule that surrounds the lens will be left in the eye to support the IOL.  Your surgeon may use stitches (sutures) to close the incision. The procedure may vary among health care providers and hospitals. What happens after the procedure?  Your blood pressure, heart rate, breathing rate, and blood oxygen level will be monitored often until the medicines you were given have worn off.  You may be given a protective shield to wear over your eyes.  Do not drive for 24 hours if you received a sedative. This information is not intended to replace advice given to you by your health care provider. Make sure you discuss any questions you have with your health care provider. Document Released: 12/03/2011 Document Revised: 05/21/2016 Document Reviewed: 10/24/2015 Elsevier Interactive Patient Education  2017  [employer].

## 2017-02-03 ENCOUNTER — Encounter (HOSPITAL_COMMUNITY): Payer: Self-pay

## 2017-02-03 ENCOUNTER — Other Ambulatory Visit: Payer: Self-pay

## 2017-02-03 ENCOUNTER — Encounter (HOSPITAL_COMMUNITY)
Admission: RE | Admit: 2017-02-03 | Discharge: 2017-02-03 | Disposition: A | Discharge status: Home / Self Care | Payer: Medicare Other | Source: Ambulatory Visit | Attending: Ophthalmology | Admitting: Ophthalmology

## 2017-02-03 DIAGNOSIS — Z01810 Encounter for preprocedural cardiovascular examination: Secondary | ICD-10-CM | POA: Insufficient documentation

## 2017-02-03 DIAGNOSIS — Z01812 Encounter for preprocedural laboratory examination: Secondary | ICD-10-CM | POA: Insufficient documentation

## 2017-02-03 HISTORY — DX: Hypothyroidism, unspecified: E03.9

## 2017-02-03 LAB — BASIC METABOLIC PANEL
Anion gap: 8 (ref 5–15)
BUN: 13 mg/dL (ref 6–20)
CHLORIDE: 106 mmol/L (ref 101–111)
CO2: 24 mmol/L (ref 22–32)
CREATININE: 0.83 mg/dL (ref 0.44–1.00)
Calcium: 8.8 mg/dL — ABNORMAL LOW (ref 8.9–10.3)
GFR calc Af Amer: >60 mL/min (ref >60)
GLUCOSE: 122 mg/dL — AB (ref 65–99)
Potassium: 3.6 mmol/L (ref 3.5–5.1)
Sodium: 138 mmol/L (ref 135–145)

## 2017-02-03 LAB — CBC
HEMATOCRIT: 38.2 % (ref 36.0–46.0)
HEMOGLOBIN: 13.2 g/dL (ref 12.0–15.0)
MCH: 30.7 pg (ref 26.0–34.0)
MCHC: 34.6 g/dL (ref 30.0–36.0)
MCV: 88.8 fL (ref 78.0–100.0)
Platelets: 255 10*3/uL (ref 150–400)
RBC: 4.3 MIL/uL (ref 3.87–5.11)
RDW: 13.2 % (ref 11.5–15.5)
WBC: 7.9 10*3/uL (ref 4.0–10.5)

## 2017-02-07 NOTE — Progress Notes (Signed)
Patient ID: Ashley Wise, female   DOB: 1941-07-27, 76 y.o.   MRN: [id number] Chief Complaint  Patient presents with  . Pessary Check    clean    Blood pressure 100/60, pulse 60, weight 139 lb (63 kg).  Ashley Wise presents today for routine follow up related to her pessary.   She uses a milex ring with support #4 She reports no vaginal discharge or vaginal bleeding.  Exam reveals no undue vaginal mucosal pressure of breakdown, no discharge and no vaginal bleeding.  The pessary is removed, cleaned and replaced without difficulty.    BENNETT KAUZLARICH will be sen back in 4 months for continued follow up.  Her right lateral leg pain has resolved  Florian Buff, MD

## 2017-02-08 ENCOUNTER — Encounter (HOSPITAL_COMMUNITY): Payer: Self-pay

## 2017-02-08 ENCOUNTER — Ambulatory Visit (HOSPITAL_COMMUNITY): Payer: Medicare Other | Admitting: Anesthesiology

## 2017-02-08 ENCOUNTER — Encounter (HOSPITAL_COMMUNITY)
Admission: RE | Disposition: A | Discharge status: Home / Self Care | Payer: Self-pay | Source: Ambulatory Visit | Attending: Ophthalmology

## 2017-02-08 ENCOUNTER — Ambulatory Visit (HOSPITAL_COMMUNITY)
Admission: RE | Admit: 2017-02-08 | Discharge: 2017-02-08 | Disposition: A | Discharge status: Home / Self Care | Payer: Medicare Other | Source: Ambulatory Visit | Attending: Ophthalmology | Admitting: Ophthalmology

## 2017-02-08 DIAGNOSIS — H269 Unspecified cataract: Secondary | ICD-10-CM | POA: Diagnosis not present

## 2017-02-08 DIAGNOSIS — E039 Hypothyroidism, unspecified: Secondary | ICD-10-CM | POA: Insufficient documentation

## 2017-02-08 DIAGNOSIS — H25812 Combined forms of age-related cataract, left eye: Secondary | ICD-10-CM | POA: Insufficient documentation

## 2017-02-08 HISTORY — PX: CATARACT EXTRACTION W/PHACO: SHX586

## 2017-02-08 SURGERY — PHACOEMULSIFICATION, CATARACT, WITH IOL INSERTION
Anesthesia: Monitor Anesthesia Care | Site: Eye | Laterality: Left

## 2017-02-08 MED ORDER — ONDANSETRON HCL 4 MG/2ML IJ SOLN
4.0000 mg | Freq: Once | INTRAMUSCULAR | Status: AC
  Administered 2017-02-08: 4 mg via INTRAVENOUS

## 2017-02-08 MED ORDER — ONDANSETRON HCL 4 MG/2ML IJ SOLN
INTRAMUSCULAR | Status: AC
  Filled 2017-02-08: qty 2

## 2017-02-08 MED ORDER — BSS IO SOLN
INTRAOCULAR | Status: DC | PRN
  Administered 2017-02-08: 15 mL via INTRAOCULAR

## 2017-02-08 MED ORDER — LACTATED RINGERS IV SOLN
INTRAVENOUS | Status: DC
  Administered 2017-02-08: 09:00:00 via INTRAVENOUS

## 2017-02-08 MED ORDER — LIDOCAINE HCL (PF) 1 % IJ SOLN
INTRAMUSCULAR | Status: DC | PRN
  Administered 2017-02-08: .9 mL

## 2017-02-08 MED ORDER — PROVISC 10 MG/ML IO SOLN
INTRAOCULAR | Status: DC | PRN
  Administered 2017-02-08: 0.85 mL via INTRAOCULAR

## 2017-02-08 MED ORDER — FENTANYL CITRATE (PF) 100 MCG/2ML IJ SOLN
INTRAMUSCULAR | Status: AC
  Filled 2017-02-08: qty 2

## 2017-02-08 MED ORDER — TETRACAINE HCL 0.5 % OP SOLN
1.0000 [drp] | OPHTHALMIC | Status: AC
  Administered 2017-02-08 (×3): 1 [drp] via OPHTHALMIC

## 2017-02-08 MED ORDER — LIDOCAINE HCL 3.5 % OP GEL
1.0000 "application " | Freq: Once | OPHTHALMIC | Status: AC
  Administered 2017-02-08: 1 via OPHTHALMIC

## 2017-02-08 MED ORDER — NEOMYCIN-POLYMYXIN-DEXAMETH 3.5-10000-0.1 OP SUSP
OPHTHALMIC | Status: DC | PRN
  Administered 2017-02-08: 2 [drp] via OPHTHALMIC

## 2017-02-08 MED ORDER — FENTANYL CITRATE (PF) 100 MCG/2ML IJ SOLN
25.0000 ug | INTRAMUSCULAR | Status: AC
  Administered 2017-02-08 (×2): 25 ug via INTRAVENOUS

## 2017-02-08 MED ORDER — MIDAZOLAM HCL 2 MG/2ML IJ SOLN
1.0000 mg | INTRAMUSCULAR | Status: AC
  Administered 2017-02-08: 2 mg via INTRAVENOUS

## 2017-02-08 MED ORDER — EPINEPHRINE PF 1 MG/ML IJ SOLN
INTRAOCULAR | Status: DC | PRN
  Administered 2017-02-08: 500 mL

## 2017-02-08 MED ORDER — PHENYLEPHRINE HCL 2.5 % OP SOLN
1.0000 [drp] | OPHTHALMIC | Status: AC
  Administered 2017-02-08 (×3): 1 [drp] via OPHTHALMIC

## 2017-02-08 MED ORDER — MIDAZOLAM HCL 2 MG/2ML IJ SOLN
INTRAMUSCULAR | Status: AC
  Filled 2017-02-08: qty 2

## 2017-02-08 MED ORDER — EPINEPHRINE PF 1 MG/ML IJ SOLN
INTRAMUSCULAR | Status: AC
  Filled 2017-02-08: qty 1

## 2017-02-08 MED ORDER — LIDOCAINE 3.5 % OP GEL OPTIME - NO CHARGE
OPHTHALMIC | Status: DC | PRN
  Administered 2017-02-08: 2 [drp] via OPHTHALMIC

## 2017-02-08 MED ORDER — CYCLOPENTOLATE-PHENYLEPHRINE 0.2-1 % OP SOLN
1.0000 [drp] | OPHTHALMIC | Status: AC
  Administered 2017-02-08 (×3): 1 [drp] via OPHTHALMIC

## 2017-02-08 MED ORDER — POVIDONE-IODINE 5 % OP SOLN
OPHTHALMIC | Status: DC | PRN
  Administered 2017-02-08: 1 via OPHTHALMIC

## 2017-02-08 SURGICAL SUPPLY — 23 items
CAPSULAR TENSION RING-AMO (OPHTHALMIC RELATED) IMPLANT
CLOTH BEACON ORANGE TIMEOUT ST (SAFETY) ×2 IMPLANT
EYE SHIELD UNIVERSAL CLEAR (GAUZE/BANDAGES/DRESSINGS) ×2 IMPLANT
GLOVE BIOGEL PI IND STRL 6.5 (GLOVE) IMPLANT
GLOVE BIOGEL PI IND STRL 7.0 (GLOVE) IMPLANT
GLOVE BIOGEL PI INDICATOR 6.5 (GLOVE) ×4
GLOVE BIOGEL PI INDICATOR 7.0 (GLOVE)
GLOVE EXAM NITRILE LRG STRL (GLOVE) IMPLANT
GLOVE EXAM NITRILE MD LF STRL (GLOVE) IMPLANT
KIT VITRECTOMY (OPHTHALMIC RELATED) IMPLANT
PAD ARMBOARD 7.5X6 YLW CONV (MISCELLANEOUS) ×2 IMPLANT
PROC W NO LENS (INTRAOCULAR LENS)
PROC W SPEC LENS (INTRAOCULAR LENS)
PROCESS W NO LENS (INTRAOCULAR LENS) IMPLANT
PROCESS W SPEC LENS (INTRAOCULAR LENS) IMPLANT
RETRACTOR IRIS SIGHTPATH (OPHTHALMIC RELATED) IMPLANT
RING MALYGIN (MISCELLANEOUS) IMPLANT
SIGHTPATH CAT PROC W REG LENS (Ophthalmic Related) ×3 IMPLANT
SYRINGE LUER LOK 1CC (MISCELLANEOUS) ×2 IMPLANT
TAPE SURG TRANSPORE 1 IN (GAUZE/BANDAGES/DRESSINGS) IMPLANT
TAPE SURGICAL TRANSPORE 1 IN (GAUZE/BANDAGES/DRESSINGS) ×2
VISCOELASTIC ADDITIONAL (OPHTHALMIC RELATED) IMPLANT
WATER STERILE IRR 250ML POUR (IV SOLUTION) ×2 IMPLANT

## 2017-02-08 NOTE — H&P (Signed)
I have reviewed the H&P, the patient was re-examined, and I have identified no interval changes in medical condition and plan of care since the history and physical of record  

## 2017-02-08 NOTE — Discharge Instructions (Signed)

## 2017-02-08 NOTE — Anesthesia Preprocedure Evaluation (Signed)
Anesthesia Evaluation  Patient identified by MRN, date of birth, ID band Patient awake    Reviewed: Allergy & Precautions, NPO status , Patient's Chart, lab work & pertinent test results  History of Anesthesia Complications (+) PONV and history of anesthetic complications  Airway Mallampati: II  TM Distance: >3 FB     Dental  (+) Teeth Intact   Pulmonary    breath sounds clear to auscultation       Cardiovascular negative cardio ROS   Rhythm:Regular Rate:Normal     Neuro/Psych    GI/Hepatic negative GI ROS,   Endo/Other  Hypothyroidism   Renal/GU      Musculoskeletal   Abdominal   Peds  Hematology   Anesthesia Other Findings   Reproductive/Obstetrics                             Anesthesia Physical Anesthesia Plan  ASA: II  Anesthesia Plan: MAC   Post-op Pain Management:    Induction:   Airway Management Planned: Nasal Cannula  Additional Equipment:   Intra-op Plan:   Post-operative Plan:   Informed Consent: I have reviewed the patients History and Physical, chart, labs and discussed the procedure including the risks, benefits and alternatives for the proposed anesthesia with the patient or authorized representative who has indicated his/her understanding and acceptance.     Plan Discussed with:   Anesthesia Plan Comments:         Anesthesia Quick Evaluation  

## 2017-02-08 NOTE — Op Note (Signed)
Date of Admission: 02/08/2017  Date of Surgery: 02/08/2017  Pre-Op Dx: Cataract Left  Eye  Post-Op Dx: Senile Combined Cataract  Left  Eye,  Dx Code T66.060  Surgeon: Tonny Branch, M.D.  Assistants: None  Anesthesia: Topical with MAC  Indications: Painless, progressive loss of vision with compromise of daily activities.  Surgery: Cataract Extraction with Intraocular lens Implant Left Eye  Discription: The patient had dilating drops and viscous lidocaine placed into the Left eye in the pre-op holding area. After transfer to the operating room, a time out was performed. The patient was then prepped and draped. Beginning with a 45 degree blade a paracentesis port was made at the surgeon's 2 o'clock position. The anterior chamber was then filled with 1% non-preserved lidocaine. This was followed by filling the anterior chamber with Provisc.  A 2.38m keratome blade was used to make a clear corneal incision at the temporal limbus.  A bent cystatome needle was used to create a continuous tear capsulotomy. Hydrodissection was performed with balanced salt solution on a Fine canula. The lens nucleus was then removed using the phacoemulsification handpiece. Residual cortex was removed with the I&A handpiece. The anterior chamber and capsular bag were refilled with Provisc. A posterior chamber intraocular lens was placed into the capsular bag with it's injector. The implant was positioned with the Kuglan hook. The Provisc was then removed from the anterior chamber and capsular bag with the I&A handpiece. Stromal hydration of the main incision and paracentesis port was performed with BSS on a Fine canula. The wounds were tested for leak which was negative. The patient tolerated the procedure well. There were no operative complications. The patient was then transferred to the recovery room in stable condition.  Complications: None  Specimen: None  EBL: None  Prosthetic device: Abbott Technis, PCB00, power  24.5, SN [id number]

## 2017-02-08 NOTE — Anesthesia Postprocedure Evaluation (Signed)
Anesthesia Post Note  Patient: Ashley Wise  Procedure(s) Performed: Procedure(s) (LRB): CATARACT EXTRACTION PHACO AND INTRAOCULAR LENS PLACEMENT LEFT EYE (Left)  Patient location during evaluation: Short Stay Anesthesia Type: MAC Level of consciousness: awake and alert and patient cooperative Pain management: pain level controlled Vital Signs Assessment: post-procedure vital signs reviewed and stable Respiratory status: spontaneous breathing, nonlabored ventilation and respiratory function stable Cardiovascular status: blood pressure returned to baseline Postop Assessment: no signs of nausea or vomiting Anesthetic complications: no     Last Vitals:  Vitals:   02/08/17 0845 02/08/17 0850  BP: (!) 165/70 (!) 170/68  Pulse:    Resp: 18 14  Temp:      Last Pain:  Vitals:   02/08/17 0807  TempSrc: Oral                 Madaline Lefeber J

## 2017-02-08 NOTE — Transfer of Care (Signed)
Immediate Anesthesia Transfer of Care Note  Patient: Ashley Wise  Procedure(s) Performed: Procedure(s) with comments: CATARACT EXTRACTION PHACO AND INTRAOCULAR LENS PLACEMENT LEFT EYE (Left) - CDE:  11.49  Patient Location: Short Stay  Anesthesia Type:MAC  Level of Consciousness: awake  Airway & Oxygen Therapy: Patient Spontanous Breathing  Post-op Assessment: Report given to RN and Post -op Vital signs reviewed and stable  Post vital signs: Reviewed and stable  Last Vitals:  Vitals:   02/08/17 0845 02/08/17 0850  BP: (!) 165/70 (!) 170/68  Pulse:    Resp: 18 14  Temp:      Last Pain:  Vitals:   02/08/17 0807  TempSrc: Oral         Complications: No apparent anesthesia complications

## 2017-02-09 ENCOUNTER — Encounter (HOSPITAL_COMMUNITY): Payer: Self-pay | Admitting: Ophthalmology

## 2017-02-15 DIAGNOSIS — H2511 Age-related nuclear cataract, right eye: Secondary | ICD-10-CM | POA: Diagnosis not present

## 2017-03-15 ENCOUNTER — Encounter (HOSPITAL_COMMUNITY): Payer: Self-pay

## 2017-03-15 ENCOUNTER — Encounter (HOSPITAL_COMMUNITY)
Admission: RE | Admit: 2017-03-15 | Discharge: 2017-03-15 | Disposition: A | Discharge status: Home / Self Care | Payer: Medicare Other | Source: Ambulatory Visit | Attending: Ophthalmology | Admitting: Ophthalmology

## 2017-03-17 MED ORDER — TETRACAINE HCL 0.5 % OP SOLN
OPHTHALMIC | Status: AC
  Filled 2017-03-17: qty 4

## 2017-03-17 MED ORDER — PHENYLEPHRINE HCL 2.5 % OP SOLN
OPHTHALMIC | Status: AC
  Filled 2017-03-17: qty 15

## 2017-03-17 MED ORDER — LIDOCAINE HCL 3.5 % OP GEL
OPHTHALMIC | Status: AC
  Filled 2017-03-17: qty 1

## 2017-03-17 MED ORDER — CYCLOPENTOLATE-PHENYLEPHRINE 0.2-1 % OP SOLN
OPHTHALMIC | Status: AC
  Filled 2017-03-17: qty 2

## 2017-03-17 MED ORDER — NEOMYCIN-POLYMYXIN-DEXAMETH 3.5-10000-0.1 OP SUSP
OPHTHALMIC | Status: AC
  Filled 2017-03-17: qty 5

## 2017-03-18 ENCOUNTER — Ambulatory Visit (HOSPITAL_COMMUNITY): Payer: Medicare Other | Admitting: Anesthesiology

## 2017-03-18 ENCOUNTER — Encounter (HOSPITAL_COMMUNITY): Payer: Self-pay | Admitting: *Deleted

## 2017-03-18 ENCOUNTER — Ambulatory Visit (HOSPITAL_COMMUNITY)
Admission: RE | Admit: 2017-03-18 | Discharge: 2017-03-18 | Disposition: A | Discharge status: Home / Self Care | Payer: Medicare Other | Source: Ambulatory Visit | Attending: Ophthalmology | Admitting: Ophthalmology

## 2017-03-18 ENCOUNTER — Encounter (HOSPITAL_COMMUNITY)
Admission: RE | Disposition: A | Discharge status: Home / Self Care | Payer: Self-pay | Source: Ambulatory Visit | Attending: Ophthalmology

## 2017-03-18 DIAGNOSIS — E039 Hypothyroidism, unspecified: Secondary | ICD-10-CM | POA: Insufficient documentation

## 2017-03-18 DIAGNOSIS — H2511 Age-related nuclear cataract, right eye: Secondary | ICD-10-CM | POA: Insufficient documentation

## 2017-03-18 HISTORY — PX: CATARACT EXTRACTION W/PHACO: SHX586

## 2017-03-18 SURGERY — PHACOEMULSIFICATION, CATARACT, WITH IOL INSERTION
Anesthesia: Monitor Anesthesia Care | Site: Eye | Laterality: Right

## 2017-03-18 MED ORDER — LIDOCAINE HCL (PF) 1 % IJ SOLN
INTRAMUSCULAR | Status: DC | PRN
  Administered 2017-03-18: .7 mL

## 2017-03-18 MED ORDER — PHENYLEPHRINE HCL 2.5 % OP SOLN
1.0000 [drp] | OPHTHALMIC | Status: AC
  Administered 2017-03-18 (×3): 1 [drp] via OPHTHALMIC

## 2017-03-18 MED ORDER — CYCLOPENTOLATE-PHENYLEPHRINE 0.2-1 % OP SOLN
1.0000 [drp] | OPHTHALMIC | Status: AC
  Administered 2017-03-18 (×3): 1 [drp] via OPHTHALMIC

## 2017-03-18 MED ORDER — FENTANYL CITRATE (PF) 100 MCG/2ML IJ SOLN
INTRAMUSCULAR | Status: AC
  Filled 2017-03-18: qty 2

## 2017-03-18 MED ORDER — POVIDONE-IODINE 5 % OP SOLN
OPHTHALMIC | Status: DC | PRN
  Administered 2017-03-18: 1 via OPHTHALMIC

## 2017-03-18 MED ORDER — BSS IO SOLN
INTRAOCULAR | Status: DC | PRN
  Administered 2017-03-18: 15 mL via INTRAOCULAR

## 2017-03-18 MED ORDER — NEOMYCIN-POLYMYXIN-DEXAMETH 3.5-10000-0.1 OP SUSP
OPHTHALMIC | Status: DC | PRN
  Administered 2017-03-18: 2 [drp]

## 2017-03-18 MED ORDER — MIDAZOLAM HCL 2 MG/2ML IJ SOLN
1.0000 mg | INTRAMUSCULAR | Status: AC
  Administered 2017-03-18: 2 mg via INTRAVENOUS

## 2017-03-18 MED ORDER — MIDAZOLAM HCL 2 MG/2ML IJ SOLN
INTRAMUSCULAR | Status: AC
  Filled 2017-03-18: qty 2

## 2017-03-18 MED ORDER — EPINEPHRINE PF 1 MG/ML IJ SOLN
INTRAMUSCULAR | Status: DC | PRN
  Administered 2017-03-18: 500 mL

## 2017-03-18 MED ORDER — LIDOCAINE HCL 3.5 % OP GEL
1.0000 "application " | Freq: Once | OPHTHALMIC | Status: AC
  Administered 2017-03-18: 1 via OPHTHALMIC

## 2017-03-18 MED ORDER — LACTATED RINGERS IV SOLN
INTRAVENOUS | Status: DC
  Administered 2017-03-18: 07:00:00 via INTRAVENOUS

## 2017-03-18 MED ORDER — TETRACAINE HCL 0.5 % OP SOLN
1.0000 [drp] | OPHTHALMIC | Status: AC
  Administered 2017-03-18 (×3): 1 [drp] via OPHTHALMIC

## 2017-03-18 MED ORDER — PROVISC 10 MG/ML IO SOLN
INTRAOCULAR | Status: DC | PRN
  Administered 2017-03-18: 0.85 mL via INTRAOCULAR

## 2017-03-18 MED ORDER — FENTANYL CITRATE (PF) 100 MCG/2ML IJ SOLN
25.0000 ug | Freq: Once | INTRAMUSCULAR | Status: AC
  Administered 2017-03-18: 25 ug via INTRAVENOUS

## 2017-03-18 SURGICAL SUPPLY — 22 items
CAPSULAR TENSION RING-AMO (OPHTHALMIC RELATED) IMPLANT
CLOTH BEACON ORANGE TIMEOUT ST (SAFETY) ×2 IMPLANT
EYE SHIELD UNIVERSAL CLEAR (GAUZE/BANDAGES/DRESSINGS) ×2 IMPLANT
GLOVE BIOGEL PI IND STRL 6.5 (GLOVE) IMPLANT
GLOVE BIOGEL PI IND STRL 7.0 (GLOVE) IMPLANT
GLOVE BIOGEL PI INDICATOR 6.5 (GLOVE) ×2
GLOVE BIOGEL PI INDICATOR 7.0 (GLOVE) ×2
GLOVE EXAM NITRILE LRG STRL (GLOVE) ×2 IMPLANT
GLOVE EXAM NITRILE MD LF STRL (GLOVE) IMPLANT
KIT VITRECTOMY (OPHTHALMIC RELATED) IMPLANT
PAD ARMBOARD 7.5X6 YLW CONV (MISCELLANEOUS) ×2 IMPLANT
PROC W NO LENS (INTRAOCULAR LENS)
PROC W SPEC LENS (INTRAOCULAR LENS)
PROCESS W NO LENS (INTRAOCULAR LENS) IMPLANT
PROCESS W SPEC LENS (INTRAOCULAR LENS) IMPLANT
RETRACTOR IRIS SIGHTPATH (OPHTHALMIC RELATED) IMPLANT
RING MALYGIN (MISCELLANEOUS) IMPLANT
SIGHTPATH CAT PROC W REG LENS (Ophthalmic Related) ×3 IMPLANT
SYRINGE LUER LOK 1CC (MISCELLANEOUS) ×2 IMPLANT
TAPE TRANSPARENT 1/2IN (GAUZE/BANDAGES/DRESSINGS) ×2 IMPLANT
VISCOELASTIC ADDITIONAL (OPHTHALMIC RELATED) IMPLANT
WATER STERILE IRR 250ML POUR (IV SOLUTION) ×2 IMPLANT

## 2017-03-18 NOTE — Op Note (Signed)
Date of Admission: 03/18/2017  Date of Surgery: 03/18/2017  Pre-Op Dx: Cataract Right  Eye  Post-Op Dx: Senile Nuclear Cataract  Right  Eye,  Dx Code H25.11  Surgeon: Tonny Branch, M.D.  Assistants: None  Anesthesia: Topical with MAC  Indications: Painless, progressive loss of vision with compromise of daily activities.  Surgery: Cataract Extraction with Intraocular lens Implant Right Eye  Discription: The patient had dilating drops and viscous lidocaine placed into the Right eye in the pre-op holding area. After transfer to the operating room, a time out was performed. The patient was then prepped and draped. Beginning with a 87 degree blade a paracentesis port was made at the surgeon's 2 o'clock position. The anterior chamber was then filled with 1% non-preserved lidocaine. This was followed by filling the anterior chamber with Provisc.  A 2.69m keratome blade was used to make a clear corneal incision at the temporal limbus.  A bent cystatome needle was used to create a continuous tear capsulotomy. Hydrodissection was performed with balanced salt solution on a Fine canula. The lens nucleus was then removed using the phacoemulsification handpiece. Residual cortex was removed with the I&A handpiece. The anterior chamber and capsular bag were refilled with Provisc. A posterior chamber intraocular lens was placed into the capsular bag with it's injector. The implant was positioned with the Kuglan hook. The Provisc was then removed from the anterior chamber and capsular bag with the I&A handpiece. Stromal hydration of the main incision and paracentesis port was performed with BSS on a Fine canula. The wounds were tested for leak which was negative. The patient tolerated the procedure well. There were no operative complications. The patient was then transferred to the recovery room in stable condition.  Complications: None  Specimen: None  EBL: None  Prosthetic device: Abbott Technis, PCB00, power  24.0, SN [id number]

## 2017-03-18 NOTE — H&P (Signed)
I have reviewed the H&P, the patient was re-examined, and I have identified no interval changes in medical condition and plan of care since the history and physical of record  

## 2017-03-18 NOTE — Anesthesia Postprocedure Evaluation (Signed)
Anesthesia Post Note  Patient: Ashley Wise  Procedure(s) Performed: Procedure(s) (LRB): CATARACT EXTRACTION PHACO AND INTRAOCULAR LENS PLACEMENT (IOC) CDE= 6.35 (Right)  Patient location during evaluation: Short Stay Anesthesia Type: MAC Level of consciousness: awake and alert and oriented Pain management: pain level controlled Vital Signs Assessment: post-procedure vital signs reviewed and stable Respiratory status: spontaneous breathing Cardiovascular status: blood pressure returned to baseline Postop Assessment: no signs of nausea or vomiting Anesthetic complications: no     Last Vitals:  Vitals:   03/18/17 0700 03/18/17 0715  BP: (!) 154/71 (!) 132/57  Resp: (!) 26 16  Temp:      Last Pain:  Vitals:   03/18/17 0641  TempSrc: Oral                 Keyondre Hepburn

## 2017-03-18 NOTE — Discharge Instructions (Signed)

## 2017-03-18 NOTE — Anesthesia Preprocedure Evaluation (Signed)
Anesthesia Evaluation  Patient identified by MRN, date of birth, ID band Patient awake    Reviewed: Allergy & Precautions, NPO status , Patient's Chart, lab work & pertinent test results  History of Anesthesia Complications (+) PONV and history of anesthetic complications  Airway Mallampati: II  TM Distance: >3 FB     Dental  (+) Teeth Intact   Pulmonary    breath sounds clear to auscultation       Cardiovascular negative cardio ROS   Rhythm:Regular Rate:Normal     Neuro/Psych    GI/Hepatic negative GI ROS,   Endo/Other  Hypothyroidism   Renal/GU      Musculoskeletal   Abdominal   Peds  Hematology   Anesthesia Other Findings   Reproductive/Obstetrics                             Anesthesia Physical Anesthesia Plan  ASA: II  Anesthesia Plan: MAC   Post-op Pain Management:    Induction:   Airway Management Planned: Nasal Cannula  Additional Equipment:   Intra-op Plan:   Post-operative Plan:   Informed Consent: I have reviewed the patients History and Physical, chart, labs and discussed the procedure including the risks, benefits and alternatives for the proposed anesthesia with the patient or authorized representative who has indicated his/her understanding and acceptance.     Plan Discussed with:   Anesthesia Plan Comments:         Anesthesia Quick Evaluation

## 2017-03-18 NOTE — Transfer of Care (Signed)
Immediate Anesthesia Transfer of Care Note  Patient: Ashley Wise  Procedure(s) Performed: Procedure(s) with comments: CATARACT EXTRACTION PHACO AND INTRAOCULAR LENS PLACEMENT (IOC) CDE= 6.35 (Right) - right  Patient Location: Short Stay  Anesthesia Type:MAC  Level of Consciousness: awake  Airway & Oxygen Therapy: Patient Spontanous Breathing  Post-op Assessment: Report given to RN  Post vital signs: Reviewed  Last Vitals:  Vitals:   03/18/17 0700 03/18/17 0715  BP: (!) 154/71 (!) 132/57  Resp: (!) 26 16  Temp:      Last Pain:  Vitals:   03/18/17 0641  TempSrc: Oral      Patients Stated Pain Goal: 8 (88/11/03 1594)  Complications: No apparent anesthesia complications

## 2017-03-19 ENCOUNTER — Encounter (HOSPITAL_COMMUNITY): Payer: Self-pay | Admitting: Ophthalmology

## 2017-03-31 DIAGNOSIS — Z961 Presence of intraocular lens: Secondary | ICD-10-CM | POA: Diagnosis not present

## 2017-05-20 ENCOUNTER — Encounter: Payer: Self-pay | Admitting: Obstetrics & Gynecology

## 2017-05-20 ENCOUNTER — Encounter (INDEPENDENT_AMBULATORY_CARE_PROVIDER_SITE_OTHER): Payer: Self-pay

## 2017-05-20 ENCOUNTER — Ambulatory Visit (INDEPENDENT_AMBULATORY_CARE_PROVIDER_SITE_OTHER): Payer: Medicare Other | Admitting: Obstetrics & Gynecology

## 2017-05-20 VITALS — BP 126/78 | HR 67 | Wt 139.0 lb

## 2017-05-20 DIAGNOSIS — N993 Prolapse of vaginal vault after hysterectomy: Secondary | ICD-10-CM | POA: Diagnosis not present

## 2017-05-20 NOTE — Progress Notes (Signed)
Patient ID: Ashley Wise, female   DOB: [birth year], 76 y.o.   MRN: [id number] Chief Complaint  Patient presents with  . Follow-up    pessary     Blood pressure 126/78, pulse 67, weight 139 lb (63 kg).  Ashley Wise presents today for routine follow up related to her pessary.   She uses a milex ring with support #4 She reports no vaginal discharge or vaginal bleeding.  Exam reveals no undue vaginal mucosal pressure of breakdown, no discharge and no vaginal bleeding.  The pessary is removed, cleaned and replaced without difficulty.    Ashley Wise will be sen back in 4 months for continued follow up.    Florian Buff, MD

## 2017-09-20 ENCOUNTER — Ambulatory Visit (INDEPENDENT_AMBULATORY_CARE_PROVIDER_SITE_OTHER): Payer: Medicare Other | Admitting: Obstetrics & Gynecology

## 2017-09-20 ENCOUNTER — Encounter: Payer: Self-pay | Admitting: Obstetrics & Gynecology

## 2017-09-20 VITALS — BP 120/60 | HR 56 | Wt 136.4 lb

## 2017-09-20 DIAGNOSIS — N993 Prolapse of vaginal vault after hysterectomy: Secondary | ICD-10-CM

## 2017-09-20 DIAGNOSIS — Z4689 Encounter for fitting and adjustment of other specified devices: Secondary | ICD-10-CM

## 2017-09-20 NOTE — Progress Notes (Signed)
Patient ID: Ashley Wise, female   DOB: [birth year], 76 y.o.   MRN: [id number]   Chief Complaint  Patient presents with  . Pessary Check    clean/ check   Blood pressure 120/60, pulse (!) 56, weight 136 lb 6.4 oz (61.9 kg).   Ashley Wise presents today for routine follow-up and maintenance related to her pessary I placed a Milex ring with support #4 several years ago and she is been using that ever since without difficulty Specifically today she reports no problems with vaginal discharge or vaginal bleeding any discomfort odor or pain  On exam today there is no undue vaginal mucosal pressure or breakdown no bleeding is noted and there is no discharge The pessary is removed and cleaned with soap and water The pessary was replaced into the vagina without difficulty  Ashley Wise will be seen back in 4 months for continued follow-up or return as needed if any problems arise

## 2018-01-10 ENCOUNTER — Other Ambulatory Visit (HOSPITAL_COMMUNITY): Payer: Self-pay | Admitting: Internal Medicine

## 2018-01-10 ENCOUNTER — Ambulatory Visit (HOSPITAL_COMMUNITY): Payer: Medicare Other

## 2018-01-10 DIAGNOSIS — Z1231 Encounter for screening mammogram for malignant neoplasm of breast: Secondary | ICD-10-CM

## 2018-01-20 ENCOUNTER — Ambulatory Visit: Payer: Medicare Other | Admitting: Obstetrics & Gynecology

## 2018-01-20 ENCOUNTER — Encounter: Payer: Self-pay | Admitting: Obstetrics & Gynecology

## 2018-01-20 VITALS — BP 134/76 | HR 78 | Ht 63.0 in | Wt 138.0 lb

## 2018-01-20 DIAGNOSIS — N993 Prolapse of vaginal vault after hysterectomy: Secondary | ICD-10-CM

## 2018-01-20 DIAGNOSIS — Z4689 Encounter for fitting and adjustment of other specified devices: Secondary | ICD-10-CM

## 2018-01-20 NOTE — Progress Notes (Signed)
   Patient ID: EYVETTE CORDON, female   DOB: October 11, 1941, 77 y.o.   MRN: [id number]  AVANNAH DECKER comes in today for her routine follow-up related to her pessary She uses a Milex ring with support #4 and has for the last several years Specifically today she reports no problems with any discharge or bleeding odor or pain or discomfort  On exam the pessary was removed without difficulty and cleaned with warm water and soap The vaginal mucosa is intact with no breakdown and no bleeding  The pessary is replaced into the vagina without difficulty  I will see Vale back in 4 months for continued follow-up related to her pessary or as needed if any problems arise

## 2018-01-21 ENCOUNTER — Ambulatory Visit (HOSPITAL_COMMUNITY)
Admission: RE | Admit: 2018-01-21 | Discharge: 2018-01-21 | Disposition: A | Discharge status: Home / Self Care | Payer: Medicare Other | Source: Ambulatory Visit | Attending: Internal Medicine | Admitting: Internal Medicine

## 2018-01-21 DIAGNOSIS — Z1231 Encounter for screening mammogram for malignant neoplasm of breast: Secondary | ICD-10-CM | POA: Diagnosis not present

## 2018-02-28 DIAGNOSIS — E039 Hypothyroidism, unspecified: Secondary | ICD-10-CM | POA: Diagnosis not present

## 2018-02-28 DIAGNOSIS — Z780 Asymptomatic menopausal state: Secondary | ICD-10-CM | POA: Diagnosis not present

## 2018-02-28 DIAGNOSIS — Z79899 Other long term (current) drug therapy: Secondary | ICD-10-CM | POA: Diagnosis not present

## 2018-02-28 DIAGNOSIS — C50919 Malignant neoplasm of unspecified site of unspecified female breast: Secondary | ICD-10-CM | POA: Diagnosis not present

## 2018-03-07 DIAGNOSIS — Z6824 Body mass index (BMI) 24.0-24.9, adult: Secondary | ICD-10-CM | POA: Diagnosis not present

## 2018-03-07 DIAGNOSIS — R7301 Impaired fasting glucose: Secondary | ICD-10-CM | POA: Diagnosis not present

## 2018-03-07 DIAGNOSIS — Z23 Encounter for immunization: Secondary | ICD-10-CM | POA: Diagnosis not present

## 2018-03-07 DIAGNOSIS — E039 Hypothyroidism, unspecified: Secondary | ICD-10-CM | POA: Diagnosis not present

## 2018-03-07 DIAGNOSIS — Z0001 Encounter for general adult medical examination with abnormal findings: Secondary | ICD-10-CM | POA: Diagnosis not present

## 2018-03-10 ENCOUNTER — Other Ambulatory Visit (HOSPITAL_COMMUNITY): Payer: Self-pay | Admitting: Internal Medicine

## 2018-03-10 DIAGNOSIS — Z780 Asymptomatic menopausal state: Secondary | ICD-10-CM

## 2018-03-15 ENCOUNTER — Encounter: Payer: Self-pay | Admitting: Internal Medicine

## 2018-03-30 ENCOUNTER — Other Ambulatory Visit (HOSPITAL_COMMUNITY): Payer: Medicare Other

## 2018-03-30 ENCOUNTER — Encounter (HOSPITAL_COMMUNITY): Payer: Self-pay

## 2018-04-04 ENCOUNTER — Ambulatory Visit: Payer: Medicare Other

## 2018-05-20 ENCOUNTER — Ambulatory Visit: Payer: Medicare Other | Admitting: Obstetrics & Gynecology

## 2018-05-24 ENCOUNTER — Encounter (INDEPENDENT_AMBULATORY_CARE_PROVIDER_SITE_OTHER): Payer: Self-pay

## 2018-05-24 ENCOUNTER — Encounter: Payer: Self-pay | Admitting: Obstetrics & Gynecology

## 2018-05-24 ENCOUNTER — Ambulatory Visit: Payer: Medicare Other | Admitting: Obstetrics & Gynecology

## 2018-05-24 VITALS — BP 140/80 | HR 97 | Ht 63.0 in | Wt 137.0 lb

## 2018-05-24 DIAGNOSIS — Z4689 Encounter for fitting and adjustment of other specified devices: Secondary | ICD-10-CM

## 2018-05-24 DIAGNOSIS — N993 Prolapse of vaginal vault after hysterectomy: Secondary | ICD-10-CM | POA: Diagnosis not present

## 2018-05-24 NOTE — Progress Notes (Signed)
Chief Complaint  Patient presents with  . Pessary Check    clean    Blood pressure 140/80, pulse 97, height 5\' 3"  (1.6 m), weight 137 lb (62.1 kg).  Ashley Wise presents today for routine follow up related to her pessary.   She uses a milex ring with support #4  She reports no vaginal discharge or vaginal bleeding.  Exam reveals no undue vaginal mucosal pressure of breakdown, no discharge and no vaginal bleeding.  The pessary is removed, cleaned and replaced without difficulty.    ALAN RILES will be sen back in 4 months for continued follow up.  Florian Buff, MD  05/24/2018 10:55 AM

## 2018-08-11 ENCOUNTER — Encounter: Payer: Self-pay | Admitting: Obstetrics & Gynecology

## 2018-09-23 ENCOUNTER — Ambulatory Visit: Payer: Medicare Other | Admitting: Obstetrics & Gynecology

## 2018-09-23 ENCOUNTER — Other Ambulatory Visit: Payer: Self-pay

## 2018-09-23 ENCOUNTER — Encounter: Payer: Self-pay | Admitting: Obstetrics & Gynecology

## 2018-09-23 VITALS — BP 170/75 | HR 66 | Ht 63.0 in | Wt 139.0 lb

## 2018-09-23 DIAGNOSIS — N993 Prolapse of vaginal vault after hysterectomy: Secondary | ICD-10-CM | POA: Diagnosis not present

## 2018-09-23 DIAGNOSIS — Z4689 Encounter for fitting and adjustment of other specified devices: Secondary | ICD-10-CM | POA: Diagnosis not present

## 2018-09-23 NOTE — Progress Notes (Signed)
Chief Complaint  Patient presents with  . pessary maintenance    Blood pressure (!) 170/75, pulse 66, height 5\' 3"  (1.6 m), weight 139 lb (63 kg).  Ashley Wise presents today for routine follow up related to her pessary.   She uses a Milex ring with support #4 She reports no vaginal discharge or vaginal bleeding.  Exam reveals no undue vaginal mucosal pressure of breakdown, no discharge and no vaginal bleeding.  The pessary is removed, cleaned and replaced without difficulty.    Ashley Wise will be sen back in 4 months for continued follow up.  Florian Buff, MD  09/23/2018 11:20 AM

## 2018-09-26 ENCOUNTER — Ambulatory Visit: Payer: Medicare Other | Admitting: Obstetrics & Gynecology

## 2018-11-28 DIAGNOSIS — Z23 Encounter for immunization: Secondary | ICD-10-CM | POA: Diagnosis not present

## 2019-01-10 ENCOUNTER — Other Ambulatory Visit (HOSPITAL_COMMUNITY): Payer: Self-pay | Admitting: Internal Medicine

## 2019-01-10 DIAGNOSIS — Z1231 Encounter for screening mammogram for malignant neoplasm of breast: Secondary | ICD-10-CM

## 2019-01-23 ENCOUNTER — Encounter: Payer: Self-pay | Admitting: Obstetrics & Gynecology

## 2019-01-23 ENCOUNTER — Ambulatory Visit: Payer: Medicare Other | Admitting: Obstetrics & Gynecology

## 2019-01-23 VITALS — BP 178/73 | HR 84 | Ht 63.0 in | Wt 136.0 lb

## 2019-01-23 DIAGNOSIS — Z4689 Encounter for fitting and adjustment of other specified devices: Secondary | ICD-10-CM | POA: Diagnosis not present

## 2019-01-23 DIAGNOSIS — N993 Prolapse of vaginal vault after hysterectomy: Secondary | ICD-10-CM | POA: Diagnosis not present

## 2019-01-23 NOTE — Progress Notes (Signed)
Chief Complaint  Patient presents with  . Pessary Check    Blood pressure (!) 178/73, pulse 84, height 5\' 3"  (1.6 m), weight 136 lb (61.7 kg).  Ashley Wise presents today for routine follow up related to her pessary.   She uses a Milex ring with support #4 She reports no vaginal discharge or vaginal bleeding.  Exam reveals no undue vaginal mucosal pressure of breakdown, no discharge and no vaginal bleeding.  The pessary is removed, cleaned and replaced without difficulty.    Ashley Wise will be sen back in 4 months for continued follow up.  Florian Buff, MD  01/23/2019 9:48 AM

## 2019-01-25 ENCOUNTER — Ambulatory Visit (HOSPITAL_COMMUNITY)
Admission: RE | Admit: 2019-01-25 | Discharge: 2019-01-25 | Disposition: A | Discharge status: Home / Self Care | Payer: Medicare Other | Source: Ambulatory Visit | Attending: Internal Medicine | Admitting: Internal Medicine

## 2019-01-25 ENCOUNTER — Other Ambulatory Visit (HOSPITAL_COMMUNITY): Payer: Self-pay | Admitting: Internal Medicine

## 2019-01-25 DIAGNOSIS — Z1231 Encounter for screening mammogram for malignant neoplasm of breast: Secondary | ICD-10-CM

## 2019-03-20 DIAGNOSIS — Z79899 Other long term (current) drug therapy: Secondary | ICD-10-CM | POA: Diagnosis not present

## 2019-03-20 DIAGNOSIS — C50919 Malignant neoplasm of unspecified site of unspecified female breast: Secondary | ICD-10-CM | POA: Diagnosis not present

## 2019-03-20 DIAGNOSIS — E039 Hypothyroidism, unspecified: Secondary | ICD-10-CM | POA: Diagnosis not present

## 2019-03-20 DIAGNOSIS — R7301 Impaired fasting glucose: Secondary | ICD-10-CM | POA: Diagnosis not present

## 2019-03-27 DIAGNOSIS — Z803 Family history of malignant neoplasm of breast: Secondary | ICD-10-CM | POA: Diagnosis not present

## 2019-03-27 DIAGNOSIS — R202 Paresthesia of skin: Secondary | ICD-10-CM | POA: Diagnosis not present

## 2019-03-27 DIAGNOSIS — R7303 Prediabetes: Secondary | ICD-10-CM | POA: Diagnosis not present

## 2019-03-27 DIAGNOSIS — E039 Hypothyroidism, unspecified: Secondary | ICD-10-CM | POA: Diagnosis not present

## 2019-05-18 ENCOUNTER — Ambulatory Visit: Payer: Self-pay | Admitting: Obstetrics & Gynecology

## 2019-05-24 ENCOUNTER — Telehealth: Payer: Self-pay | Admitting: *Deleted

## 2019-05-24 NOTE — Telephone Encounter (Signed)
Patient informed that we are not allowing visitors or children to come to appointments at this time. Patient advised to wear a mask to her appointment if she has one. Patient denies any contact with anyone suspected or confirmed of having COVID-19. Pt denies fever, cough, sob, muscle pain, diarrhea, rash, vomiting, abdominal pain, red eye, weakness, bruising or bleeding, joint pain or severe headache.    

## 2019-05-25 ENCOUNTER — Encounter: Payer: Self-pay | Admitting: Obstetrics & Gynecology

## 2019-05-25 ENCOUNTER — Other Ambulatory Visit: Payer: Self-pay

## 2019-05-25 ENCOUNTER — Ambulatory Visit: Payer: Medicare Other | Admitting: Obstetrics & Gynecology

## 2019-05-25 VITALS — BP 169/74 | HR 73 | Ht 63.0 in | Wt 136.0 lb

## 2019-05-25 DIAGNOSIS — N993 Prolapse of vaginal vault after hysterectomy: Secondary | ICD-10-CM | POA: Diagnosis not present

## 2019-05-25 DIAGNOSIS — Z4689 Encounter for fitting and adjustment of other specified devices: Secondary | ICD-10-CM | POA: Diagnosis not present

## 2019-05-25 NOTE — Progress Notes (Signed)
Chief Complaint  Patient presents with  . Pessary Check    Blood pressure (!) 169/74, pulse 73, height 5\' 3"  (1.6 m), weight 136 lb (61.7 kg).  Ashley Wise presents today for routine follow up related to her pessary.   She uses a Milex ring with support #4 She reports little vaginal discharge or vaginal bleeding.  Exam reveals no undue vaginal mucosal pressure of breakdown, little discharge and no vaginal bleeding.  The pessary is removed, cleaned and replaced without difficulty.    Ashley Wise will be sen back in 4 months for continued follow up.  Florian Buff, MD  05/25/2019 9:12 AM

## 2019-06-28 DIAGNOSIS — E039 Hypothyroidism, unspecified: Secondary | ICD-10-CM | POA: Diagnosis not present

## 2019-06-28 DIAGNOSIS — Z79899 Other long term (current) drug therapy: Secondary | ICD-10-CM | POA: Diagnosis not present

## 2019-06-28 DIAGNOSIS — R202 Paresthesia of skin: Secondary | ICD-10-CM | POA: Diagnosis not present

## 2019-09-06 DIAGNOSIS — E039 Hypothyroidism, unspecified: Secondary | ICD-10-CM | POA: Diagnosis not present

## 2019-09-25 ENCOUNTER — Ambulatory Visit: Payer: Medicare Other | Admitting: Obstetrics & Gynecology

## 2019-09-27 ENCOUNTER — Telehealth: Payer: Self-pay | Admitting: Obstetrics & Gynecology

## 2019-09-27 NOTE — Telephone Encounter (Signed)

## 2019-09-28 ENCOUNTER — Ambulatory Visit: Payer: Medicare Other | Admitting: Obstetrics & Gynecology

## 2019-09-28 ENCOUNTER — Other Ambulatory Visit: Payer: Self-pay

## 2019-09-28 ENCOUNTER — Encounter: Payer: Self-pay | Admitting: Obstetrics & Gynecology

## 2019-09-28 VITALS — BP 165/68 | HR 67 | Ht 63.0 in | Wt 142.5 lb

## 2019-09-28 DIAGNOSIS — Z4689 Encounter for fitting and adjustment of other specified devices: Secondary | ICD-10-CM

## 2019-09-28 DIAGNOSIS — N993 Prolapse of vaginal vault after hysterectomy: Secondary | ICD-10-CM

## 2019-09-28 NOTE — Progress Notes (Signed)
Chief Complaint  Patient presents with  . Pessary cleaning    Blood pressure (!) 165/68, pulse 67, height 5\' 3"  (1.6 m), weight 142 lb 8 oz (64.6 kg).  Ashley Wise presents today for routine follow up related to her pessary.   She uses a Milex ring with support #4 She reports no vaginal discharge or vaginal bleeding.  Exam reveals no undue vaginal mucosal pressure of breakdown, no discharge and no vaginal bleeding.  The pessary is removed, cleaned and replaced without difficulty.    Ashley Wise will be sen back in 4 months for continued follow up.  Florian Buff, MD  09/28/2019 10:59 AM

## 2020-01-06 IMAGING — MG DIGITAL SCREENING UNILATERAL RIGHT MAMMOGRAM WITH CAD AND TOMO
4 series · 4 of 12 positions shown · non-contrast
Comparison: Previous exam(s).

CLINICAL DATA: Screening.

EXAM:
DIGITAL SCREENING UNILATERAL RIGHT MAMMOGRAM WITH CAD AND TOMO

[R CC synth-2D]
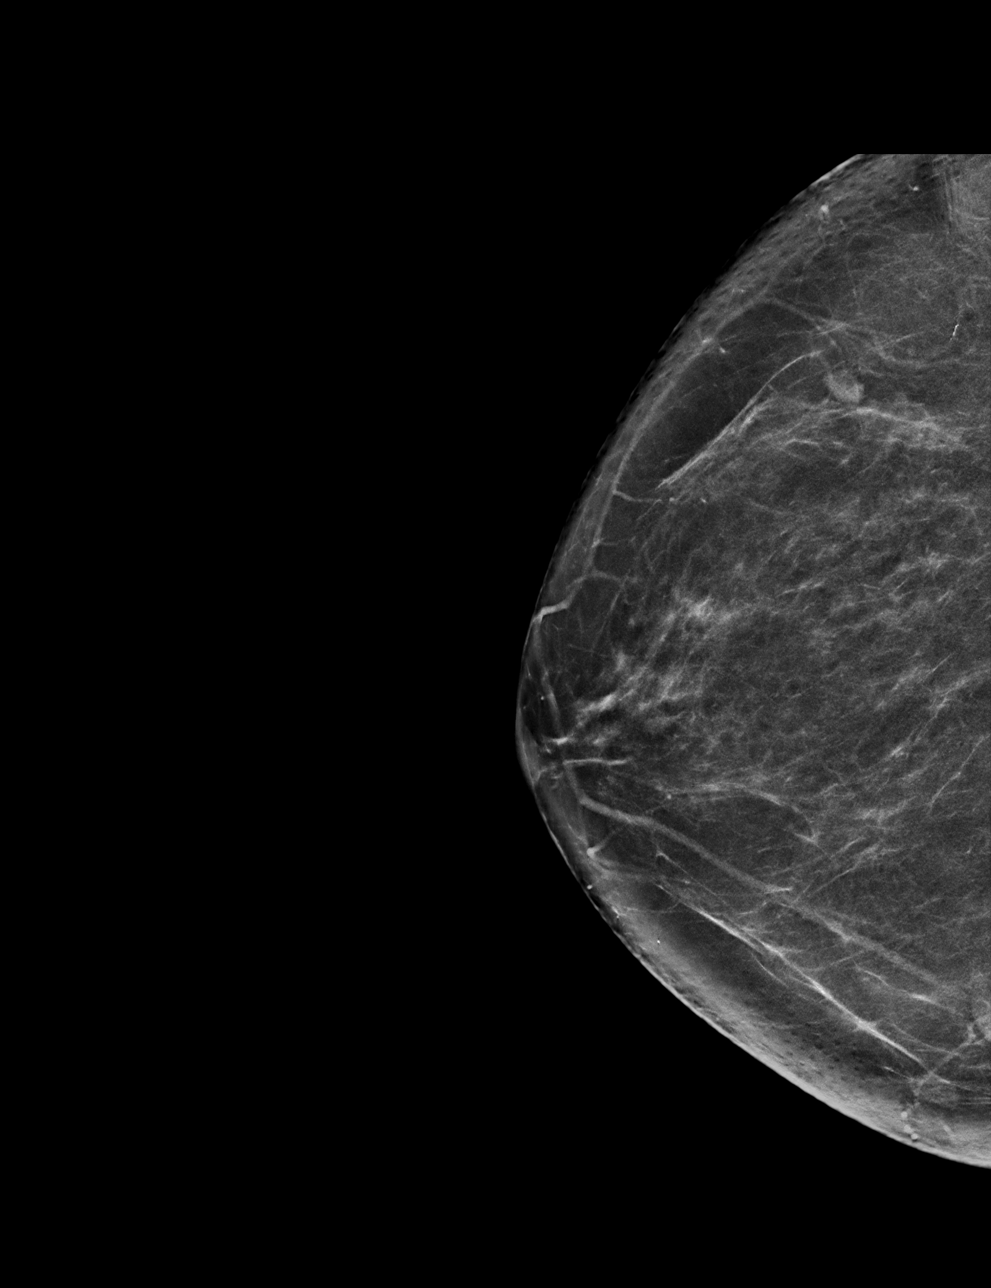

[R MLO synth-2D]
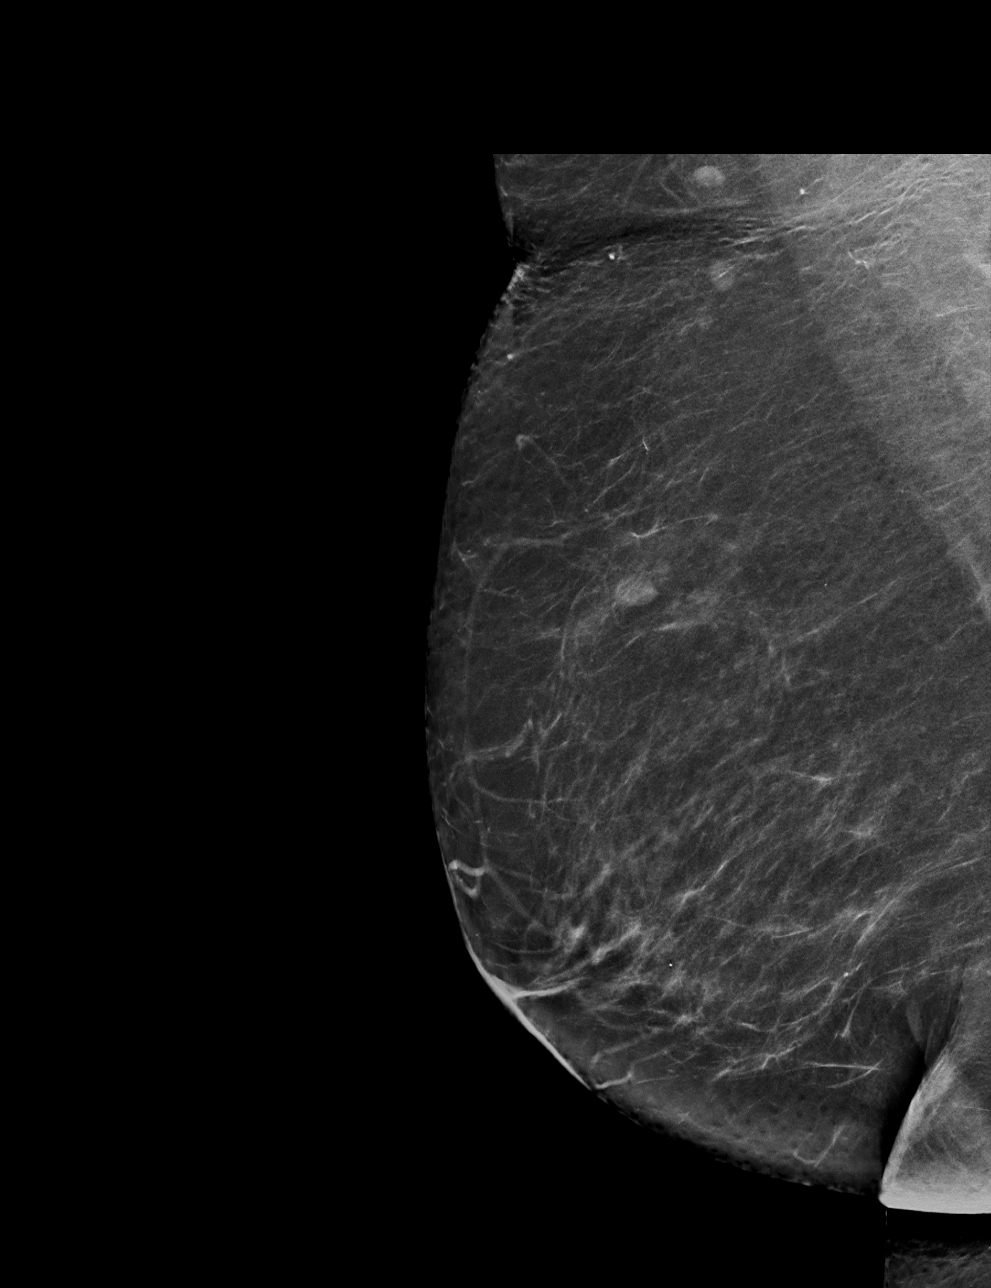

[R MLO tomo · tomo slice 37/72.0]
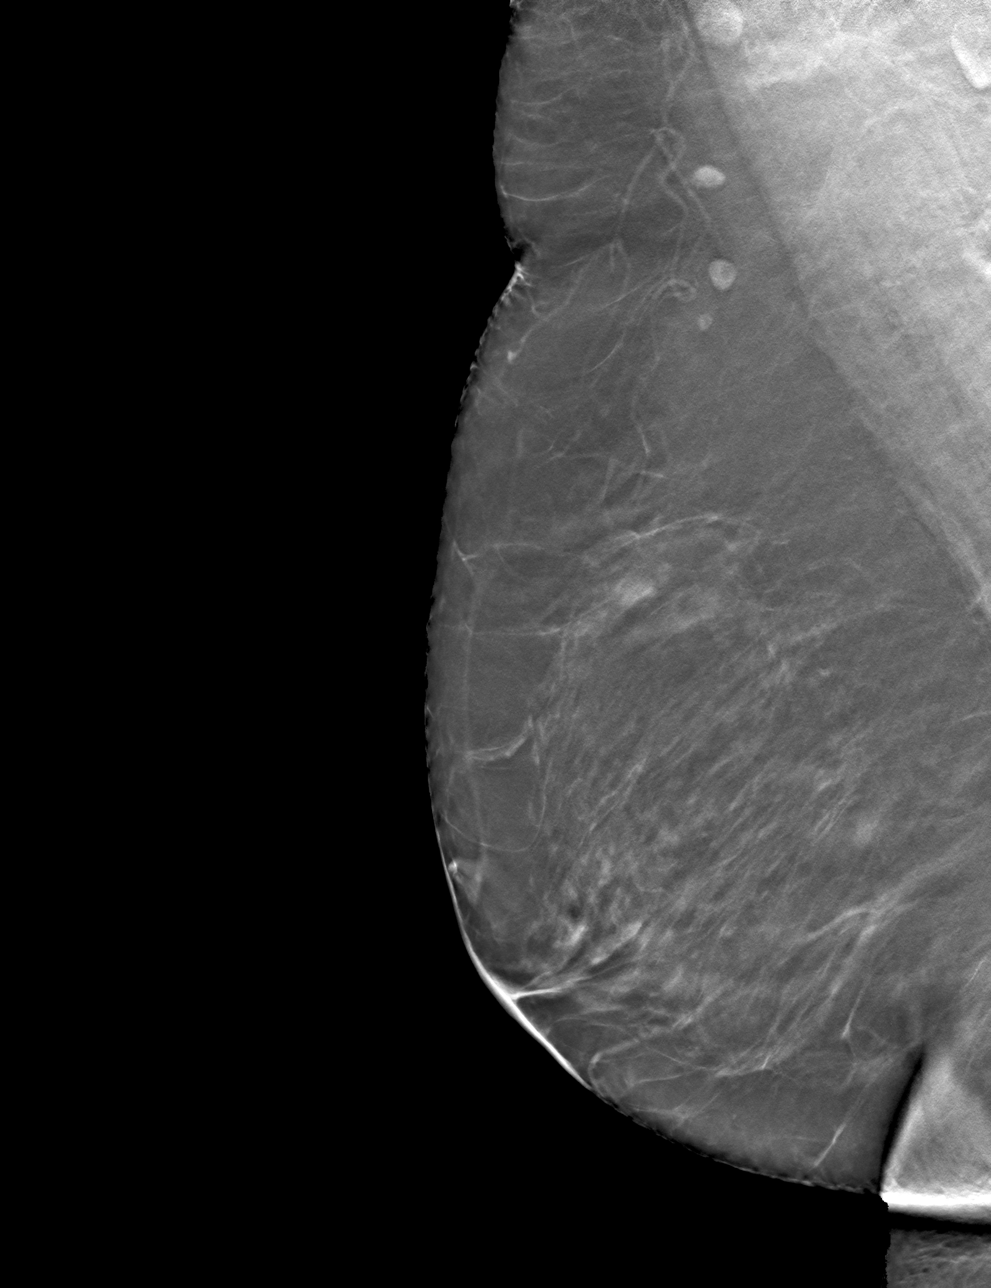

[R CC tomo · tomo slice 35/68.0]
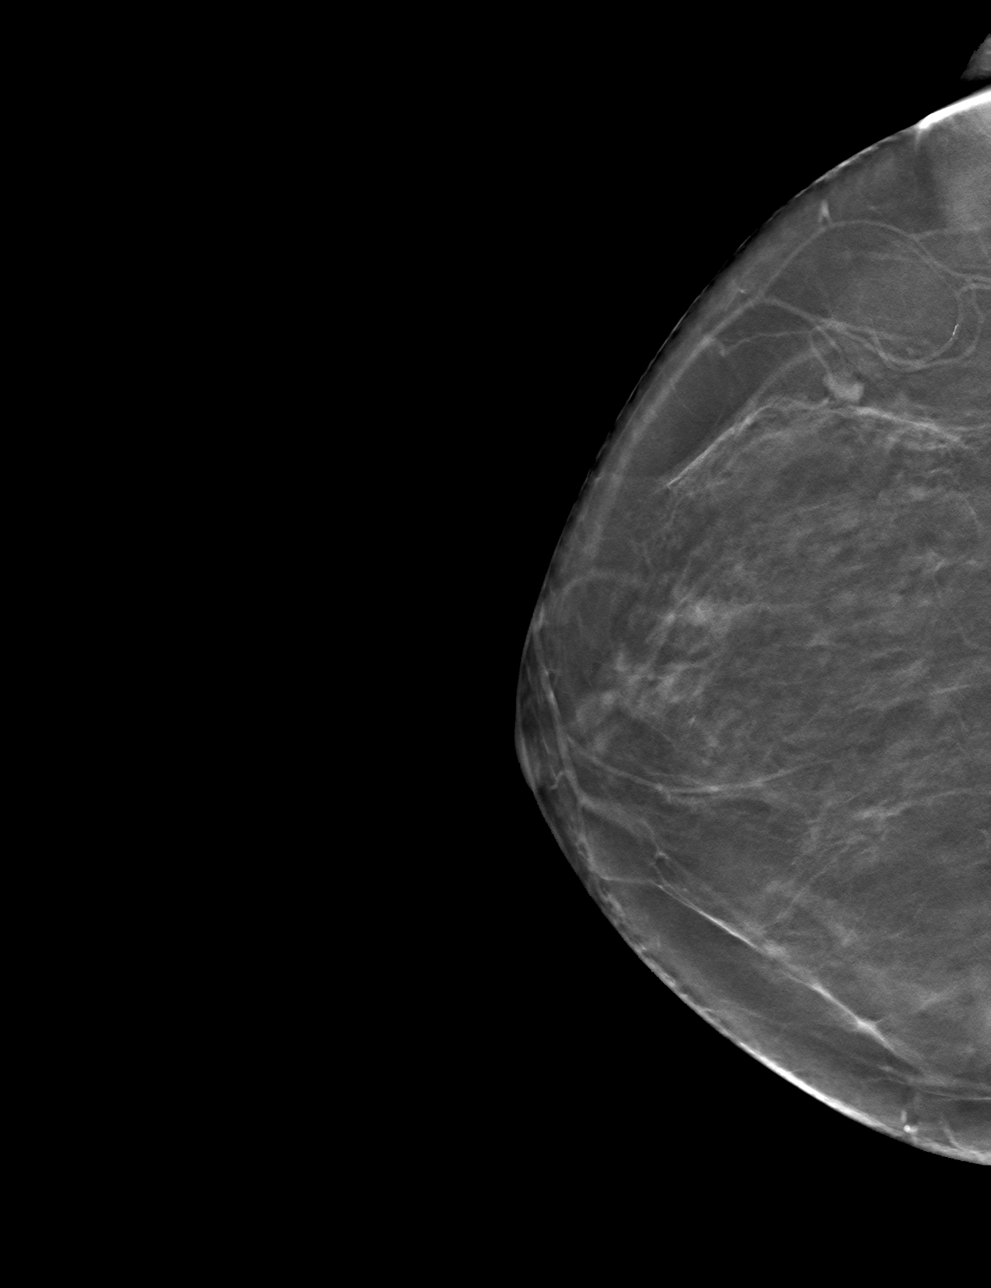

[4 of 12 positions shown; findings below may reference images not displayed]

ACR Breast Density Category b: There are scattered areas of
fibroglandular density.
FINDINGS: There are no findings suspicious for malignancy. Images were
processed with CAD.
IMPRESSION: No mammographic evidence of malignancy. A result letter of this
screening mammogram will be mailed directly to the patient.

RECOMMENDATION:
Screening mammogram in one year. (Code:3Y-2-NJC)

BI-RADS CATEGORY  1: Negative.

## 2020-01-25 ENCOUNTER — Telehealth: Payer: Self-pay | Admitting: Obstetrics & Gynecology

## 2020-01-25 NOTE — Telephone Encounter (Signed)

## 2020-01-29 ENCOUNTER — Ambulatory Visit: Payer: Medicare Other | Admitting: Obstetrics & Gynecology

## 2020-01-29 ENCOUNTER — Encounter: Payer: Self-pay | Admitting: Obstetrics & Gynecology

## 2020-01-29 ENCOUNTER — Other Ambulatory Visit: Payer: Self-pay

## 2020-01-29 VITALS — BP 145/56 | HR 71 | Ht 63.0 in | Wt 139.4 lb

## 2020-01-29 DIAGNOSIS — Z4689 Encounter for fitting and adjustment of other specified devices: Secondary | ICD-10-CM

## 2020-01-29 NOTE — Progress Notes (Signed)
Chief Complaint  Patient presents with  . Pessary Check    clean    Blood pressure (!) 145/56, pulse 71, height 5\' 3"  (1.6 m), weight 139 lb 6.4 oz (63.2 kg).  Ashley Wise presents today for routine follow up related to her pessary.   She uses a Milex ring with support #4 She reports no vaginal discharge or vaginal bleeding.  Exam reveals no undue vaginal mucosal pressure of breakdown, no discharge and no vaginal bleeding.  The pessary is removed, cleaned and replaced without difficulty.    Ashley Wise will be sen back in 4 months for continued follow up.  Florian Buff, MD  01/29/2020 9:25 AM

## 2020-02-23 ENCOUNTER — Other Ambulatory Visit (HOSPITAL_COMMUNITY): Payer: Self-pay | Admitting: Internal Medicine

## 2020-02-23 DIAGNOSIS — Z1231 Encounter for screening mammogram for malignant neoplasm of breast: Secondary | ICD-10-CM

## 2020-02-28 ENCOUNTER — Other Ambulatory Visit: Payer: Self-pay

## 2020-02-28 ENCOUNTER — Ambulatory Visit (HOSPITAL_COMMUNITY)
Admission: RE | Admit: 2020-02-28 | Discharge: 2020-02-28 | Disposition: A | Discharge status: Home / Self Care | Payer: Medicare Other | Source: Ambulatory Visit | Attending: Internal Medicine | Admitting: Internal Medicine

## 2020-02-28 DIAGNOSIS — Z1231 Encounter for screening mammogram for malignant neoplasm of breast: Secondary | ICD-10-CM

## 2020-03-26 DIAGNOSIS — C50919 Malignant neoplasm of unspecified site of unspecified female breast: Secondary | ICD-10-CM | POA: Diagnosis not present

## 2020-03-26 DIAGNOSIS — R7303 Prediabetes: Secondary | ICD-10-CM | POA: Diagnosis not present

## 2020-03-26 DIAGNOSIS — E039 Hypothyroidism, unspecified: Secondary | ICD-10-CM | POA: Diagnosis not present

## 2020-03-26 DIAGNOSIS — Z79899 Other long term (current) drug therapy: Secondary | ICD-10-CM | POA: Diagnosis not present

## 2020-04-02 DIAGNOSIS — Z0001 Encounter for general adult medical examination with abnormal findings: Secondary | ICD-10-CM | POA: Diagnosis not present

## 2020-04-02 DIAGNOSIS — I10 Essential (primary) hypertension: Secondary | ICD-10-CM | POA: Diagnosis not present

## 2020-04-02 DIAGNOSIS — R7303 Prediabetes: Secondary | ICD-10-CM | POA: Diagnosis not present

## 2020-04-02 DIAGNOSIS — E039 Hypothyroidism, unspecified: Secondary | ICD-10-CM | POA: Diagnosis not present

## 2020-04-02 DIAGNOSIS — Z853 Personal history of malignant neoplasm of breast: Secondary | ICD-10-CM | POA: Diagnosis not present

## 2020-05-09 DIAGNOSIS — C50912 Malignant neoplasm of unspecified site of left female breast: Secondary | ICD-10-CM | POA: Diagnosis not present

## 2020-05-09 DIAGNOSIS — E039 Hypothyroidism, unspecified: Secondary | ICD-10-CM | POA: Diagnosis not present

## 2020-05-09 DIAGNOSIS — R7303 Prediabetes: Secondary | ICD-10-CM | POA: Diagnosis not present

## 2020-05-09 DIAGNOSIS — Z79899 Other long term (current) drug therapy: Secondary | ICD-10-CM | POA: Diagnosis not present

## 2020-05-09 DIAGNOSIS — I10 Essential (primary) hypertension: Secondary | ICD-10-CM | POA: Diagnosis not present

## 2020-05-16 DIAGNOSIS — E1159 Type 2 diabetes mellitus with other circulatory complications: Secondary | ICD-10-CM | POA: Diagnosis not present

## 2020-05-16 DIAGNOSIS — E039 Hypothyroidism, unspecified: Secondary | ICD-10-CM | POA: Diagnosis not present

## 2020-05-16 DIAGNOSIS — I10 Essential (primary) hypertension: Secondary | ICD-10-CM | POA: Diagnosis not present

## 2020-05-28 ENCOUNTER — Encounter: Payer: Self-pay | Admitting: Obstetrics & Gynecology

## 2020-05-28 ENCOUNTER — Ambulatory Visit: Payer: Medicare Other | Admitting: Obstetrics & Gynecology

## 2020-05-28 VITALS — BP 173/66 | HR 63 | Ht 62.0 in | Wt 142.0 lb

## 2020-05-28 DIAGNOSIS — Z4689 Encounter for fitting and adjustment of other specified devices: Secondary | ICD-10-CM

## 2020-05-28 DIAGNOSIS — Z466 Encounter for fitting and adjustment of urinary device: Secondary | ICD-10-CM | POA: Diagnosis not present

## 2020-05-28 DIAGNOSIS — N993 Prolapse of vaginal vault after hysterectomy: Secondary | ICD-10-CM

## 2020-05-28 NOTE — Progress Notes (Signed)
Chief Complaint  Patient presents with  . Pessary Check    Blood pressure (!) 173/66, pulse 63, height 5\' 2"  (1.575 m), weight 142 lb (64.4 kg).  Ashley Wise presents today for routine follow up related to her pessary.   She uses a Milex ring with support #4 She reports no vaginal discharge but a small amount of spotting vaginal bleeding.  Exam reveals no undue vaginal mucosal pressure of breakdown, no discharge and no vaginal bleeding.  The pessary is removed, cleaned and replaced without difficulty.    Ashley Wise will be sen back in 4 months for continued follow up.  Florian Buff, MD  05/28/2020 9:23 AM

## 2020-08-15 DIAGNOSIS — E039 Hypothyroidism, unspecified: Secondary | ICD-10-CM | POA: Diagnosis not present

## 2020-08-15 DIAGNOSIS — R7303 Prediabetes: Secondary | ICD-10-CM | POA: Diagnosis not present

## 2020-08-15 DIAGNOSIS — I10 Essential (primary) hypertension: Secondary | ICD-10-CM | POA: Diagnosis not present

## 2020-08-15 DIAGNOSIS — Z79899 Other long term (current) drug therapy: Secondary | ICD-10-CM | POA: Diagnosis not present

## 2020-09-27 ENCOUNTER — Other Ambulatory Visit: Payer: Self-pay

## 2020-09-27 ENCOUNTER — Ambulatory Visit (INDEPENDENT_AMBULATORY_CARE_PROVIDER_SITE_OTHER): Payer: Medicare Other | Admitting: Obstetrics & Gynecology

## 2020-09-27 ENCOUNTER — Encounter: Payer: Self-pay | Admitting: Obstetrics & Gynecology

## 2020-09-27 VITALS — BP 167/64 | HR 66 | Ht 63.0 in | Wt 140.0 lb

## 2020-09-27 DIAGNOSIS — N993 Prolapse of vaginal vault after hysterectomy: Secondary | ICD-10-CM | POA: Diagnosis not present

## 2020-09-27 DIAGNOSIS — Z4689 Encounter for fitting and adjustment of other specified devices: Secondary | ICD-10-CM

## 2020-09-27 NOTE — Progress Notes (Signed)
Chief Complaint  Patient presents with  . Pessary Check    Blood pressure (!) 167/64, pulse 66, height 5\' 3"  (1.6 m), weight 140 lb (63.5 kg).  Ashley Wise presents today for routine follow up related to her pessary.   She uses a Milex ring with support #4 She reports no vaginal discharge or vaginal bleeding.  Exam reveals no undue vaginal mucosal pressure of breakdown, no discharge and no vaginal bleeding.  The pessary is removed, cleaned and replaced without difficulty.    Ashley Wise will be sen back in 4 months for continued follow up.  Florian Buff, MD  09/27/2020 9:51 AM

## 2020-11-22 DIAGNOSIS — E1159 Type 2 diabetes mellitus with other circulatory complications: Secondary | ICD-10-CM | POA: Diagnosis not present

## 2020-11-22 DIAGNOSIS — E039 Hypothyroidism, unspecified: Secondary | ICD-10-CM | POA: Diagnosis not present

## 2020-11-22 DIAGNOSIS — Z79899 Other long term (current) drug therapy: Secondary | ICD-10-CM | POA: Diagnosis not present

## 2020-11-29 DIAGNOSIS — R21 Rash and other nonspecific skin eruption: Secondary | ICD-10-CM | POA: Diagnosis not present

## 2020-11-29 DIAGNOSIS — I10 Essential (primary) hypertension: Secondary | ICD-10-CM | POA: Diagnosis not present

## 2020-11-29 DIAGNOSIS — R7309 Other abnormal glucose: Secondary | ICD-10-CM | POA: Diagnosis not present

## 2020-11-29 DIAGNOSIS — E039 Hypothyroidism, unspecified: Secondary | ICD-10-CM | POA: Diagnosis not present

## 2020-11-29 DIAGNOSIS — E1100 Type 2 diabetes mellitus with hyperosmolarity without nonketotic hyperglycemic-hyperosmolar coma (NKHHC): Secondary | ICD-10-CM | POA: Diagnosis not present

## 2021-01-20 ENCOUNTER — Ambulatory Visit: Payer: Medicare Other | Admitting: Obstetrics & Gynecology

## 2021-01-27 ENCOUNTER — Ambulatory Visit: Payer: Medicare Other | Admitting: Obstetrics & Gynecology

## 2021-01-27 ENCOUNTER — Encounter: Payer: Self-pay | Admitting: Obstetrics & Gynecology

## 2021-01-27 ENCOUNTER — Other Ambulatory Visit: Payer: Self-pay

## 2021-01-27 VITALS — BP 153/73 | HR 77 | Ht 63.0 in | Wt 139.0 lb

## 2021-01-27 DIAGNOSIS — N993 Prolapse of vaginal vault after hysterectomy: Secondary | ICD-10-CM | POA: Diagnosis not present

## 2021-01-27 DIAGNOSIS — Z4689 Encounter for fitting and adjustment of other specified devices: Secondary | ICD-10-CM

## 2021-01-27 NOTE — Progress Notes (Signed)
Chief Complaint  Patient presents with  . Pessary Check    Blood pressure (!) 153/73, pulse 77, height 5\' 3"  (1.6 m), weight 139 lb (63 kg).  Ashley Wise presents today for routine follow up related to her pessary.   She uses a Milex ring with support #4 She reports no vaginal discharge and no vaginal bleeding   Likert scale(1 not bothersome -5 very bothersome)  :  1  Exam reveals no undue vaginal mucosal pressure of breakdown, no discharge and no vaginal bleeding.  Vaginal Epithelial Abnormality Classification System:   0 0    No abnormalities 1    Epithelial erythema 2    Granulation tissue 3    Epithelial break or erosion, 1 cm or less 4    Epithelial break or erosion, 1 cm or greater  The pessary is removed, cleaned and replaced without difficulty.    No diagnosis found.   [name] will be sen back in 4 months for continued follow up.  Florian Buff, MD  01/27/2021 2:22 PM

## 2021-02-25 ENCOUNTER — Other Ambulatory Visit (HOSPITAL_COMMUNITY): Payer: Self-pay | Admitting: Internal Medicine

## 2021-02-25 DIAGNOSIS — Z1231 Encounter for screening mammogram for malignant neoplasm of breast: Secondary | ICD-10-CM

## 2021-03-06 DIAGNOSIS — E1159 Type 2 diabetes mellitus with other circulatory complications: Secondary | ICD-10-CM | POA: Diagnosis not present

## 2021-03-10 ENCOUNTER — Ambulatory Visit (HOSPITAL_COMMUNITY)
Admission: RE | Admit: 2021-03-10 | Discharge: 2021-03-10 | Disposition: A | Discharge status: Home / Self Care | Payer: Medicare Other | Source: Ambulatory Visit | Attending: Internal Medicine | Admitting: Internal Medicine

## 2021-03-10 ENCOUNTER — Other Ambulatory Visit: Payer: Self-pay

## 2021-03-10 DIAGNOSIS — Z1231 Encounter for screening mammogram for malignant neoplasm of breast: Secondary | ICD-10-CM | POA: Insufficient documentation

## 2021-04-03 DIAGNOSIS — I10 Essential (primary) hypertension: Secondary | ICD-10-CM | POA: Diagnosis not present

## 2021-04-03 DIAGNOSIS — E1100 Type 2 diabetes mellitus with hyperosmolarity without nonketotic hyperglycemic-hyperosmolar coma (NKHHC): Secondary | ICD-10-CM | POA: Diagnosis not present

## 2021-05-27 ENCOUNTER — Ambulatory Visit: Payer: Medicare Other | Admitting: Obstetrics & Gynecology

## 2021-05-27 ENCOUNTER — Other Ambulatory Visit: Payer: Self-pay

## 2021-05-27 ENCOUNTER — Encounter: Payer: Self-pay | Admitting: Obstetrics & Gynecology

## 2021-05-27 VITALS — BP 152/68 | HR 70 | Ht 63.0 in | Wt 136.5 lb

## 2021-05-27 DIAGNOSIS — N993 Prolapse of vaginal vault after hysterectomy: Secondary | ICD-10-CM | POA: Diagnosis not present

## 2021-05-27 DIAGNOSIS — Z4689 Encounter for fitting and adjustment of other specified devices: Secondary | ICD-10-CM

## 2021-05-27 NOTE — Progress Notes (Signed)
Chief Complaint  Patient presents with  . Pessary Check    Blood pressure (!) 152/68, pulse 70, height 5\' 3"  (1.6 m), weight 136 lb 8 oz (61.9 kg).  Ashley Wise presents today for routine follow up related to her pessary.   She uses a Milex ring with support #4 She reports no vaginal discharge and no vaginal bleeding   Likert scale(1 not bothersome -5 very bothersome)  :  1  Exam reveals no undue vaginal mucosal pressure of breakdown, no discharge and no vaginal bleeding.  Vaginal Epithelial Abnormality Classification System:   0 0    No abnormalities 1    Epithelial erythema 2    Granulation tissue 3    Epithelial break or erosion, 1 cm or less 4    Epithelial break or erosion, 1 cm or greater  The pessary is removed, cleaned and replaced without difficulty.      ICD-10-CM   1. Pessary maintenance, Milex ring with support #4  Z46.89   2. Prolapse of vaginal vault after hysterectomy  N99.3      KAYTI POSS will be sen back in 4 months for continued follow up.  Florian Buff, MD  05/27/2021 9:37 AM

## 2021-06-24 DIAGNOSIS — E039 Hypothyroidism, unspecified: Secondary | ICD-10-CM | POA: Diagnosis not present

## 2021-06-24 DIAGNOSIS — E1159 Type 2 diabetes mellitus with other circulatory complications: Secondary | ICD-10-CM | POA: Diagnosis not present

## 2021-06-24 DIAGNOSIS — Z79899 Other long term (current) drug therapy: Secondary | ICD-10-CM | POA: Diagnosis not present

## 2021-07-01 DIAGNOSIS — E1100 Type 2 diabetes mellitus with hyperosmolarity without nonketotic hyperglycemic-hyperosmolar coma (NKHHC): Secondary | ICD-10-CM | POA: Diagnosis not present

## 2021-07-01 DIAGNOSIS — Z6824 Body mass index (BMI) 24.0-24.9, adult: Secondary | ICD-10-CM | POA: Diagnosis not present

## 2021-07-01 DIAGNOSIS — I10 Essential (primary) hypertension: Secondary | ICD-10-CM | POA: Diagnosis not present

## 2021-07-01 DIAGNOSIS — Z0000 Encounter for general adult medical examination without abnormal findings: Secondary | ICD-10-CM | POA: Diagnosis not present

## 2021-07-01 DIAGNOSIS — E039 Hypothyroidism, unspecified: Secondary | ICD-10-CM | POA: Diagnosis not present

## 2021-07-14 DIAGNOSIS — E119 Type 2 diabetes mellitus without complications: Secondary | ICD-10-CM | POA: Diagnosis not present

## 2021-09-22 ENCOUNTER — Other Ambulatory Visit: Payer: Self-pay

## 2021-09-22 ENCOUNTER — Ambulatory Visit: Payer: Medicare Other | Admitting: Obstetrics & Gynecology

## 2021-09-22 ENCOUNTER — Encounter: Payer: Self-pay | Admitting: Obstetrics & Gynecology

## 2021-09-22 VITALS — BP 133/60 | HR 72 | Ht 63.0 in | Wt 135.0 lb

## 2021-09-22 DIAGNOSIS — Z4689 Encounter for fitting and adjustment of other specified devices: Secondary | ICD-10-CM

## 2021-09-22 DIAGNOSIS — N993 Prolapse of vaginal vault after hysterectomy: Secondary | ICD-10-CM | POA: Diagnosis not present

## 2021-09-22 NOTE — Progress Notes (Signed)
Chief Complaint  Patient presents with   pessary recheck    Blood pressure 133/60, pulse 72, height 5\' 3"  (1.6 m), weight 135 lb (61.2 kg).  Ashley Wise presents today for routine follow up related to her pessary.   She uses a Milex ring with support #4 She reports no vaginal discharge and no vaginal bleeding   Likert scale(1 not bothersome -5 very bothersome)  :  1  Exam reveals no undue vaginal mucosal pressure of breakdown, no discharge and no vaginal bleeding.  Vaginal Epithelial Abnormality Classification System:   0 0    No abnormalities 1    Epithelial erythema 2    Granulation tissue 3    Epithelial break or erosion, 1 cm or less 4    Epithelial break or erosion, 1 cm or greater  The pessary is removed, cleaned and replaced without difficulty.      ICD-10-CM   1. Pessary maintenance, Milex ring with support #4  Z46.89     2. Prolapse of vaginal vault after hysterectomy  N99.3        [name] will be sen back in 4 months for continued follow up.  Florian Buff, MD  09/22/2021 9:40 AM

## 2021-10-29 DIAGNOSIS — I10 Essential (primary) hypertension: Secondary | ICD-10-CM | POA: Diagnosis not present

## 2021-10-29 DIAGNOSIS — E1159 Type 2 diabetes mellitus with other circulatory complications: Secondary | ICD-10-CM | POA: Diagnosis not present

## 2021-10-29 DIAGNOSIS — Z79899 Other long term (current) drug therapy: Secondary | ICD-10-CM | POA: Diagnosis not present

## 2021-11-05 DIAGNOSIS — I10 Essential (primary) hypertension: Secondary | ICD-10-CM | POA: Diagnosis not present

## 2021-11-05 DIAGNOSIS — Z23 Encounter for immunization: Secondary | ICD-10-CM | POA: Diagnosis not present

## 2021-11-05 DIAGNOSIS — E1100 Type 2 diabetes mellitus with hyperosmolarity without nonketotic hyperglycemic-hyperosmolar coma (NKHHC): Secondary | ICD-10-CM | POA: Diagnosis not present

## 2022-01-22 ENCOUNTER — Ambulatory Visit: Payer: Medicare Other | Admitting: Obstetrics & Gynecology

## 2022-01-27 ENCOUNTER — Encounter: Payer: Self-pay | Admitting: Obstetrics & Gynecology

## 2022-01-27 ENCOUNTER — Ambulatory Visit: Payer: Medicare Other | Admitting: Obstetrics & Gynecology

## 2022-01-27 ENCOUNTER — Other Ambulatory Visit: Payer: Self-pay

## 2022-01-27 VITALS — BP 157/62 | HR 79 | Ht 63.0 in | Wt 137.0 lb

## 2022-01-27 DIAGNOSIS — Z4689 Encounter for fitting and adjustment of other specified devices: Secondary | ICD-10-CM | POA: Diagnosis not present

## 2022-01-27 DIAGNOSIS — N993 Prolapse of vaginal vault after hysterectomy: Secondary | ICD-10-CM

## 2022-01-27 NOTE — Progress Notes (Signed)
Chief Complaint  Patient presents with   Pessary Check    Blood pressure (!) 157/62, pulse 79, height 5\' 3"  (1.6 m), weight 137 lb (62.1 kg).  Ashley Wise presents today for routine follow up related to her pessary.   She uses a Milex ring with support #4 She reports no vaginal discharge and no vaginal bleeding   Likert scale(1 not bothersome -5 very bothersome)  :  1  Exam reveals no undue vaginal mucosal pressure of breakdown, no discharge and no vaginal bleeding.  Vaginal Epithelial Abnormality Classification System:   0 0    No abnormalities 1    Epithelial erythema 2    Granulation tissue 3    Epithelial break or erosion, 1 cm or less 4    Epithelial break or erosion, 1 cm or greater  The pessary is removed, cleaned and replaced without difficulty.      ICD-10-CM   1. Pessary maintenance, Milex ring with support #4  Z46.89     2. 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  Florian Buff, MD  01/27/2022 9:47 AM

## 2022-02-02 DIAGNOSIS — I10 Essential (primary) hypertension: Secondary | ICD-10-CM | POA: Diagnosis not present

## 2022-02-02 DIAGNOSIS — Z79899 Other long term (current) drug therapy: Secondary | ICD-10-CM | POA: Diagnosis not present

## 2022-02-02 DIAGNOSIS — E1159 Type 2 diabetes mellitus with other circulatory complications: Secondary | ICD-10-CM | POA: Diagnosis not present

## 2022-02-02 DIAGNOSIS — E785 Hyperlipidemia, unspecified: Secondary | ICD-10-CM | POA: Diagnosis not present

## 2022-02-09 DIAGNOSIS — I10 Essential (primary) hypertension: Secondary | ICD-10-CM | POA: Diagnosis not present

## 2022-02-09 DIAGNOSIS — E785 Hyperlipidemia, unspecified: Secondary | ICD-10-CM | POA: Diagnosis not present

## 2022-02-23 ENCOUNTER — Other Ambulatory Visit (HOSPITAL_COMMUNITY): Payer: Self-pay | Admitting: Internal Medicine

## 2022-02-23 DIAGNOSIS — Z1231 Encounter for screening mammogram for malignant neoplasm of breast: Secondary | ICD-10-CM

## 2022-03-12 ENCOUNTER — Other Ambulatory Visit: Payer: Self-pay

## 2022-03-12 ENCOUNTER — Ambulatory Visit (HOSPITAL_COMMUNITY)
Admission: RE | Admit: 2022-03-12 | Discharge: 2022-03-12 | Disposition: A | Discharge status: Home / Self Care | Payer: Medicare Other | Source: Ambulatory Visit | Attending: Internal Medicine | Admitting: Internal Medicine

## 2022-03-12 DIAGNOSIS — Z1231 Encounter for screening mammogram for malignant neoplasm of breast: Secondary | ICD-10-CM | POA: Diagnosis not present

## 2022-03-24 DIAGNOSIS — Z0001 Encounter for general adult medical examination with abnormal findings: Secondary | ICD-10-CM | POA: Diagnosis not present

## 2022-03-24 DIAGNOSIS — I10 Essential (primary) hypertension: Secondary | ICD-10-CM | POA: Diagnosis not present

## 2022-05-26 ENCOUNTER — Encounter: Payer: Self-pay | Admitting: Obstetrics & Gynecology

## 2022-05-26 ENCOUNTER — Ambulatory Visit: Payer: Medicare Other | Admitting: Obstetrics & Gynecology

## 2022-05-26 VITALS — BP 138/62 | HR 78 | Ht 63.0 in | Wt 137.5 lb

## 2022-05-26 DIAGNOSIS — Z4689 Encounter for fitting and adjustment of other specified devices: Secondary | ICD-10-CM | POA: Diagnosis not present

## 2022-05-26 DIAGNOSIS — N993 Prolapse of vaginal vault after hysterectomy: Secondary | ICD-10-CM

## 2022-05-26 NOTE — Progress Notes (Signed)
Chief Complaint  Patient presents with   Pessary Check    Blood pressure 138/62, pulse 78, height '5\' 3"'$  (1.6 m), weight 137 lb 8 oz (62.4 kg).  Ashley Wise presents today for routine follow up related to her pessary.   She uses a Milex ring with support #4 She reports no vaginal discharge and no vaginal bleeding   Likert scale(1 not bothersome -5 very bothersome)  :  1  Exam reveals no undue vaginal mucosal pressure of breakdown, no discharge and no vaginal bleeding.  Vaginal Epithelial Abnormality Classification System:   0 0    No abnormalities 1    Epithelial erythema 2    Granulation tissue 3    Epithelial break or erosion, 1 cm or less 4    Epithelial break or erosion, 1 cm or greater  The pessary is removed, cleaned and replaced without difficulty.      ICD-10-CM   1. Pessary maintenance, Milex ring with support #4  Z46.89     2. Prolapse of vaginal vault after hysterectomy  N99.3        Ashley Wise will be sen back in 4 months for continued follow up.  Florian Buff, MD  05/26/2022 10:01 AM

## 2022-06-16 DIAGNOSIS — Z853 Personal history of malignant neoplasm of breast: Secondary | ICD-10-CM | POA: Diagnosis not present

## 2022-06-16 DIAGNOSIS — Z23 Encounter for immunization: Secondary | ICD-10-CM | POA: Diagnosis not present

## 2022-06-16 DIAGNOSIS — Z6824 Body mass index (BMI) 24.0-24.9, adult: Secondary | ICD-10-CM | POA: Diagnosis not present

## 2022-06-16 DIAGNOSIS — E785 Hyperlipidemia, unspecified: Secondary | ICD-10-CM | POA: Diagnosis not present

## 2022-06-16 DIAGNOSIS — E1122 Type 2 diabetes mellitus with diabetic chronic kidney disease: Secondary | ICD-10-CM | POA: Diagnosis not present

## 2022-06-16 DIAGNOSIS — I10 Essential (primary) hypertension: Secondary | ICD-10-CM | POA: Diagnosis not present

## 2022-06-16 DIAGNOSIS — Z0001 Encounter for general adult medical examination with abnormal findings: Secondary | ICD-10-CM | POA: Diagnosis not present

## 2022-06-16 DIAGNOSIS — R7301 Impaired fasting glucose: Secondary | ICD-10-CM | POA: Diagnosis not present

## 2022-06-16 DIAGNOSIS — E039 Hypothyroidism, unspecified: Secondary | ICD-10-CM | POA: Diagnosis not present

## 2022-06-25 DIAGNOSIS — I10 Essential (primary) hypertension: Secondary | ICD-10-CM | POA: Diagnosis not present

## 2022-06-25 DIAGNOSIS — E118 Type 2 diabetes mellitus with unspecified complications: Secondary | ICD-10-CM | POA: Diagnosis not present

## 2022-06-25 DIAGNOSIS — E049 Nontoxic goiter, unspecified: Secondary | ICD-10-CM | POA: Diagnosis not present

## 2022-06-25 DIAGNOSIS — Z79899 Other long term (current) drug therapy: Secondary | ICD-10-CM | POA: Diagnosis not present

## 2022-06-25 DIAGNOSIS — C50919 Malignant neoplasm of unspecified site of unspecified female breast: Secondary | ICD-10-CM | POA: Diagnosis not present

## 2022-07-02 ENCOUNTER — Other Ambulatory Visit (HOSPITAL_COMMUNITY): Payer: Self-pay | Admitting: Internal Medicine

## 2022-07-02 DIAGNOSIS — E1122 Type 2 diabetes mellitus with diabetic chronic kidney disease: Secondary | ICD-10-CM | POA: Diagnosis not present

## 2022-07-02 DIAGNOSIS — I10 Essential (primary) hypertension: Secondary | ICD-10-CM | POA: Diagnosis not present

## 2022-07-02 DIAGNOSIS — Z6824 Body mass index (BMI) 24.0-24.9, adult: Secondary | ICD-10-CM | POA: Diagnosis not present

## 2022-07-02 DIAGNOSIS — E785 Hyperlipidemia, unspecified: Secondary | ICD-10-CM | POA: Diagnosis not present

## 2022-07-02 DIAGNOSIS — Z780 Asymptomatic menopausal state: Secondary | ICD-10-CM

## 2022-07-02 DIAGNOSIS — Z23 Encounter for immunization: Secondary | ICD-10-CM | POA: Diagnosis not present

## 2022-07-02 DIAGNOSIS — R7301 Impaired fasting glucose: Secondary | ICD-10-CM | POA: Diagnosis not present

## 2022-07-02 DIAGNOSIS — Z0001 Encounter for general adult medical examination with abnormal findings: Secondary | ICD-10-CM | POA: Diagnosis not present

## 2022-07-02 DIAGNOSIS — Z853 Personal history of malignant neoplasm of breast: Secondary | ICD-10-CM | POA: Diagnosis not present

## 2022-07-02 DIAGNOSIS — E039 Hypothyroidism, unspecified: Secondary | ICD-10-CM | POA: Diagnosis not present

## 2022-07-09 ENCOUNTER — Ambulatory Visit (HOSPITAL_COMMUNITY)
Admission: RE | Admit: 2022-07-09 | Discharge: 2022-07-09 | Disposition: A | Discharge status: Home / Self Care | Payer: Medicare Other | Source: Ambulatory Visit | Attending: Internal Medicine | Admitting: Internal Medicine

## 2022-07-09 DIAGNOSIS — Z780 Asymptomatic menopausal state: Secondary | ICD-10-CM | POA: Diagnosis not present

## 2022-07-09 DIAGNOSIS — M85851 Other specified disorders of bone density and structure, right thigh: Secondary | ICD-10-CM | POA: Diagnosis not present

## 2022-07-21 ENCOUNTER — Ambulatory Visit: Payer: Medicare Other | Admitting: Podiatry

## 2022-07-21 ENCOUNTER — Ambulatory Visit (INDEPENDENT_AMBULATORY_CARE_PROVIDER_SITE_OTHER): Payer: Medicare Other

## 2022-07-21 DIAGNOSIS — M7671 Peroneal tendinitis, right leg: Secondary | ICD-10-CM | POA: Diagnosis not present

## 2022-07-21 DIAGNOSIS — M79671 Pain in right foot: Secondary | ICD-10-CM

## 2022-07-21 NOTE — Patient Instructions (Signed)
Look for Voltaren gel at the pharmacy over the counter or online (also known as diclofenac 1% gel). Apply to the painful areas 3-4x daily with the supplied dosing card. Allow to dry for 10 minutes before going into socks/shoes   Peroneal Tendinopathy Rehab Ask your health care provider which exercises are safe for you. Do exercises exactly as told by your health care provider and adjust them as directed. It is normal to feel mild stretching, pulling, tightness, or discomfort as you do these exercises. Stop right away if you feel sudden pain or your pain gets worse. Do not begin these exercises until told by your health care provider. Stretching and range-of-motion exercises These exercises warm up your muscles and joints and improve the movement and flexibility of your ankle. These exercises also help to relieve pain and stiffness. Gastroc and soleus stretch, standing  This is an exercise in which you stand on a step and use your body weight to stretch your calf muscles. To do this exercise: Stand on the edge of a step on the ball of your left / right foot. The ball of your foot is on the walking surface, right under your toes. Keep your other foot firmly on the same step. Hold on to the wall, a railing, or a chair for balance. Slowly lift your other foot, allowing your body weight to press your left / right heel down over the edge of the step. You should feel a stretch in your left / right calf (gastrocnemius and soleus). Hold this position for 15 seconds. Return both feet to the step. Repeat this exercise with a slight bend in your left / right knee. Repeat 5 times with your left / right knee straight and 5 times with your left / right knee bent. Complete this exercise 2 times a day. Strengthening exercises These exercises build strength and endurance in your foot and ankle. Endurance is the ability to use your muscles for a long time, even after they get tired. Ankle dorsiflexion with  band   Secure a rubber exercise band or tube to an object, such as a table leg, that will not move when the band is pulled. Secure the other end of the band around your left / right foot. Sit on the floor, facing the object with your left / right leg extended. The band or tube should be slightly tense when your foot is relaxed. Slowly flex your left / right ankle and toes to bring your foot toward you (dorsiflexion). Hold this position for 15 seconds. Let the band or tube slowly pull your foot back to the starting position. Repeat 5 times. Complete this exercise 2 times a day. Ankle eversion Sit on the floor with your legs straight out in front of you. Loop a rubber exercise band or tube around the ball of your left / right foot. The ball of your foot is on the walking surface, right under your toes. Hold the ends of the band in your hands, or secure the band to a stable object. The band or tube should be slightly tense when your foot is relaxed. Slowly push your foot outward, away from your other leg (eversion). Hold this position for 15 seconds. Slowly return your foot to the starting position. Repeat 5 times. Complete this exercise 2 times a day. Plantar flexion, standing  This exercise is sometimes called standing heel raise. Stand with your feet shoulder-width apart. Place your hands on a wall or table to steady yourself as   needed, but try not to use it for support. Keep your weight spread evenly over the width of your feet while you slowly rise up on your toes (plantar flexion). If told by your health care provider: Shift your weight toward your left / right leg until you feel challenged. Stand on your left / right leg only. Hold this position for 15 seconds. Repeat 2 times. Complete this exercise 2 times a day. Single leg stand Without shoes, stand near a railing or in a doorway. You may hold on to the railing or door frame as needed. Stand on your left / right foot. Keep your  big toe down on the floor and try to keep your arch lifted. Do not roll to the outside of your foot. If this exercise is too easy, you can try it with your eyes closed or while standing on a pillow. Hold this position for 15 seconds. Repeat 5 times. Complete this exercise 2 times a day. This information is not intended to replace advice given to you by your health care provider. Make sure you discuss any questions you have with your health care provider. Document Revised: 04/04/2019 Document Reviewed: 04/04/2019 Elsevier Patient Education  2020 [employer]  

## 2022-07-21 NOTE — Progress Notes (Signed)
  Subjective:  Patient ID: Ashley Wise, female    DOB: 12-Sep-1941,  MRN: [id number]  Chief Complaint  Patient presents with   Foot Pain    Pain in right foot located on the anterior and lateral sides, intermittent, x1 yr, gradually has become worse     81 y.o. female presents with the above complaint. History confirmed with patient.  She noticed it developed suddenly with a sharp pain, as far she knows she did not fall or injure it, she did have a bad sprain in the ankle many years ago but did not need treatment at the time Objective:  Physical Exam: warm, good capillary refill, no trophic changes or ulcerative lesions, normal DP and PT pulses, normal sensory exam, and mild tenderness over sinus tarsi and peroneal tendons.   Radiographs: Multiple views x-ray of the right foot:  Evidence of prior dorsal talonavicular avulsion fracture Assessment:   1. Peroneal tendinitis, right      Plan:  Patient was evaluated and treated and all questions answered.  I suspect she is having issues with stability and tendinitis of the peroneal's, possible subluxation of the peroneals from the groove due to prior injury.  She did have an avulsion fracture that appears to be chronic in nature on the dorsal talonavicular joint.  So far not severely painful and she is able to WBAT in regular shoe gear, I did recommend that she support the ankle with a supportive Tri-Lock ankle brace which was dispensed today to support the lateral ankle.  I recommend physical therapy at home and she was given a home therapy plan.  I recommended Voltaren gel as well.  She will return if it does not improve  Return if symptoms worsen or fail to improve.

## 2022-07-27 ENCOUNTER — Other Ambulatory Visit: Payer: Self-pay | Admitting: Podiatry

## 2022-07-27 DIAGNOSIS — M7671 Peroneal tendinitis, right leg: Secondary | ICD-10-CM

## 2022-09-25 ENCOUNTER — Encounter: Payer: Self-pay | Admitting: Obstetrics & Gynecology

## 2022-09-25 ENCOUNTER — Ambulatory Visit: Payer: Medicare Other | Admitting: Obstetrics & Gynecology

## 2022-09-25 VITALS — BP 137/67 | HR 68 | Ht 63.0 in | Wt 136.0 lb

## 2022-09-25 DIAGNOSIS — Z4689 Encounter for fitting and adjustment of other specified devices: Secondary | ICD-10-CM | POA: Diagnosis not present

## 2022-09-25 DIAGNOSIS — N993 Prolapse of vaginal vault after hysterectomy: Secondary | ICD-10-CM | POA: Diagnosis not present

## 2022-09-25 NOTE — Progress Notes (Signed)
Chief Complaint  Patient presents with   Pessary Check    Blood pressure 137/67, pulse 68, height '5\' 3"'$  (1.6 m), weight 136 lb (61.7 kg).  Ashley Wise presents today for routine follow up related to her pessary.   She uses a Milex ring with support #4 She reports no vaginal discharge and no vaginal bleeding   Likert scale(1 not bothersome -5 very bothersome)  :  1  Exam reveals no undue vaginal mucosal pressure of breakdown, no discharge and no vaginal bleeding.  Vaginal Epithelial Abnormality Classification System:   0 0    No abnormalities 1    Epithelial erythema 2    Granulation tissue 3    Epithelial break or erosion, 1 cm or less 4    Epithelial break or erosion, 1 cm or greater  The pessary is removed, cleaned and replaced without difficulty.    No diagnosis found.   Ashley Wise will be sen back in 4 months for continued follow up.  Florian Buff, MD  09/25/2022 10:02 AM

## 2022-10-28 DIAGNOSIS — E039 Hypothyroidism, unspecified: Secondary | ICD-10-CM | POA: Diagnosis not present

## 2022-10-28 DIAGNOSIS — Z79899 Other long term (current) drug therapy: Secondary | ICD-10-CM | POA: Diagnosis not present

## 2022-10-28 DIAGNOSIS — E1129 Type 2 diabetes mellitus with other diabetic kidney complication: Secondary | ICD-10-CM | POA: Diagnosis not present

## 2022-11-04 DIAGNOSIS — E1122 Type 2 diabetes mellitus with diabetic chronic kidney disease: Secondary | ICD-10-CM | POA: Diagnosis not present

## 2022-11-04 DIAGNOSIS — M65312 Trigger thumb, left thumb: Secondary | ICD-10-CM | POA: Diagnosis not present

## 2022-11-04 DIAGNOSIS — R7309 Other abnormal glucose: Secondary | ICD-10-CM | POA: Diagnosis not present

## 2022-11-04 DIAGNOSIS — I10 Essential (primary) hypertension: Secondary | ICD-10-CM | POA: Diagnosis not present

## 2022-11-04 DIAGNOSIS — E039 Hypothyroidism, unspecified: Secondary | ICD-10-CM | POA: Diagnosis not present

## 2022-11-24 DIAGNOSIS — M65312 Trigger thumb, left thumb: Secondary | ICD-10-CM | POA: Diagnosis not present

## 2023-01-25 ENCOUNTER — Ambulatory Visit: Payer: Medicare Other | Admitting: Obstetrics & Gynecology

## 2023-01-26 ENCOUNTER — Ambulatory Visit: Payer: Medicare Other | Admitting: Obstetrics & Gynecology

## 2023-01-26 ENCOUNTER — Encounter: Payer: Self-pay | Admitting: Obstetrics & Gynecology

## 2023-01-26 VITALS — BP 140/57 | HR 73 | Ht 63.0 in | Wt 135.8 lb

## 2023-01-26 DIAGNOSIS — Z4689 Encounter for fitting and adjustment of other specified devices: Secondary | ICD-10-CM | POA: Diagnosis not present

## 2023-01-26 DIAGNOSIS — N993 Prolapse of vaginal vault after hysterectomy: Secondary | ICD-10-CM | POA: Diagnosis not present

## 2023-01-26 NOTE — Progress Notes (Signed)
Chief Complaint  Patient presents with   pessary maintenance    Blood pressure (!) 140/57, pulse 73, height '5\' 3"'$  (1.6 m), weight 135 lb 12.8 oz (61.6 kg).  Ashley Wise presents today for routine follow up related to her pessary.   She uses a Milex ring with support #4 She reports no vaginal discharge and no vaginal bleeding   Likert scale(1 not bothersome -5 very bothersome)  :  1  Exam reveals no undue vaginal mucosal pressure of breakdown, no discharge and no vaginal bleeding.  Vaginal Epithelial Abnormality Classification System:   0 0    No abnormalities 1    Epithelial erythema 2    Granulation tissue 3    Epithelial break or erosion, 1 cm or less 4    Epithelial break or erosion, 1 cm or greater  The pessary is removed, cleaned and replaced without difficulty.      ICD-10-CM   1. Pessary maintenance, Milex ring with support #4  Z46.89     2. Prolapse of vaginal vault after hysterectomy  N99.3        [name] will be sen back in 4 months for continued follow up.  Florian Buff, MD  01/26/2023 10:23 AM

## 2023-02-01 DIAGNOSIS — E1129 Type 2 diabetes mellitus with other diabetic kidney complication: Secondary | ICD-10-CM | POA: Diagnosis not present

## 2023-02-01 DIAGNOSIS — E039 Hypothyroidism, unspecified: Secondary | ICD-10-CM | POA: Diagnosis not present

## 2023-02-01 DIAGNOSIS — Z79899 Other long term (current) drug therapy: Secondary | ICD-10-CM | POA: Diagnosis not present

## 2023-02-08 DIAGNOSIS — E039 Hypothyroidism, unspecified: Secondary | ICD-10-CM | POA: Diagnosis not present

## 2023-02-08 DIAGNOSIS — I10 Essential (primary) hypertension: Secondary | ICD-10-CM | POA: Diagnosis not present

## 2023-02-08 DIAGNOSIS — E1122 Type 2 diabetes mellitus with diabetic chronic kidney disease: Secondary | ICD-10-CM | POA: Diagnosis not present

## 2023-02-10 ENCOUNTER — Other Ambulatory Visit (HOSPITAL_COMMUNITY): Payer: Self-pay | Admitting: Internal Medicine

## 2023-02-10 DIAGNOSIS — Z1231 Encounter for screening mammogram for malignant neoplasm of breast: Secondary | ICD-10-CM

## 2023-03-15 ENCOUNTER — Ambulatory Visit (HOSPITAL_COMMUNITY)
Admission: RE | Admit: 2023-03-15 | Discharge: 2023-03-15 | Disposition: A | Discharge status: Home / Self Care | Payer: Medicare Other | Source: Ambulatory Visit | Attending: Internal Medicine | Admitting: Internal Medicine

## 2023-03-15 DIAGNOSIS — Z1231 Encounter for screening mammogram for malignant neoplasm of breast: Secondary | ICD-10-CM | POA: Diagnosis not present

## 2023-04-23 ENCOUNTER — Encounter: Payer: Self-pay | Admitting: Obstetrics & Gynecology

## 2023-04-23 ENCOUNTER — Ambulatory Visit: Payer: Medicare Other | Admitting: Obstetrics & Gynecology

## 2023-04-23 VITALS — BP 131/69 | HR 71 | Ht 63.0 in | Wt 136.0 lb

## 2023-04-23 DIAGNOSIS — Z4689 Encounter for fitting and adjustment of other specified devices: Secondary | ICD-10-CM

## 2023-04-23 DIAGNOSIS — N993 Prolapse of vaginal vault after hysterectomy: Secondary | ICD-10-CM

## 2023-04-23 NOTE — Progress Notes (Signed)
Chief Complaint  Patient presents with   Pessary Check    Blood pressure 131/69, pulse 71, height 5\' 3"  (1.6 m), weight 136 lb (61.7 kg).  Ashley Wise presents today for routine follow up related to her pessary.   She uses a Milex ring with support #4 She reports no vaginal discharge and no vaginal bleeding   Likert scale(1 not bothersome -5 very bothersome)  :  1  Exam reveals no undue vaginal mucosal pressure of breakdown, no discharge and no vaginal bleeding.  Vaginal Epithelial Abnormality Classification System:   0 0    No abnormalities 1    Epithelial erythema 2    Granulation tissue 3    Epithelial break or erosion, 1 cm or less 4    Epithelial break or erosion, 1 cm or greater  The pessary is removed, cleaned and replaced without difficulty.      ICD-10-CM   1. Pessary maintenance, Milex ring with support #4  Z46.89     2. Prolapse of vaginal vault after hysterectomy  N99.3        Ashley Wise will be sen back in 4 months for continued follow up.  Lazaro Arms, MD  04/23/2023 10:18 AM

## 2023-04-26 ENCOUNTER — Ambulatory Visit: Payer: Medicare Other | Admitting: Obstetrics & Gynecology

## 2023-06-29 DIAGNOSIS — E039 Hypothyroidism, unspecified: Secondary | ICD-10-CM | POA: Diagnosis not present

## 2023-06-29 DIAGNOSIS — C50919 Malignant neoplasm of unspecified site of unspecified female breast: Secondary | ICD-10-CM | POA: Diagnosis not present

## 2023-06-29 DIAGNOSIS — E785 Hyperlipidemia, unspecified: Secondary | ICD-10-CM | POA: Diagnosis not present

## 2023-06-29 DIAGNOSIS — Z79899 Other long term (current) drug therapy: Secondary | ICD-10-CM | POA: Diagnosis not present

## 2023-06-29 DIAGNOSIS — I10 Essential (primary) hypertension: Secondary | ICD-10-CM | POA: Diagnosis not present

## 2023-07-06 DIAGNOSIS — E1122 Type 2 diabetes mellitus with diabetic chronic kidney disease: Secondary | ICD-10-CM | POA: Diagnosis not present

## 2023-07-06 DIAGNOSIS — R7309 Other abnormal glucose: Secondary | ICD-10-CM | POA: Diagnosis not present

## 2023-07-06 DIAGNOSIS — E785 Hyperlipidemia, unspecified: Secondary | ICD-10-CM | POA: Diagnosis not present

## 2023-07-06 DIAGNOSIS — Z0001 Encounter for general adult medical examination with abnormal findings: Secondary | ICD-10-CM | POA: Diagnosis not present

## 2023-07-06 DIAGNOSIS — E039 Hypothyroidism, unspecified: Secondary | ICD-10-CM | POA: Diagnosis not present

## 2023-07-06 DIAGNOSIS — I10 Essential (primary) hypertension: Secondary | ICD-10-CM | POA: Diagnosis not present

## 2023-07-06 DIAGNOSIS — Z0000 Encounter for general adult medical examination without abnormal findings: Secondary | ICD-10-CM | POA: Diagnosis not present

## 2023-07-20 DIAGNOSIS — E119 Type 2 diabetes mellitus without complications: Secondary | ICD-10-CM | POA: Diagnosis not present

## 2023-07-29 ENCOUNTER — Ambulatory Visit: Payer: Medicare Other | Admitting: Obstetrics & Gynecology

## 2023-07-29 ENCOUNTER — Encounter: Payer: Self-pay | Admitting: Obstetrics & Gynecology

## 2023-07-29 VITALS — BP 139/61 | HR 82 | Ht 63.0 in | Wt 134.0 lb

## 2023-07-29 DIAGNOSIS — Z4689 Encounter for fitting and adjustment of other specified devices: Secondary | ICD-10-CM

## 2023-07-29 DIAGNOSIS — N993 Prolapse of vaginal vault after hysterectomy: Secondary | ICD-10-CM | POA: Diagnosis not present

## 2023-07-29 NOTE — Progress Notes (Signed)
Chief Complaint  Patient presents with   Pessary Check    Blood pressure 139/61, pulse 82, height 5\' 3"  (1.6 m), weight 134 lb (60.8 kg).  Ashley Wise presents today for routine follow up related to her pessary.   She uses a Milex ring with support #4 She reports no vaginal discharge and no vaginal bleeding   Likert scale(1 not bothersome -5 very bothersome)  :  1  Exam reveals no undue vaginal mucosal pressure of breakdown, no discharge and no vaginal bleeding.  Vaginal Epithelial Abnormality Classification System:   0 0    No abnormalities 1    Epithelial erythema 2    Granulation tissue 3    Epithelial break or erosion, 1 cm or less 4    Epithelial break or erosion, 1 cm or greater  The pessary is removed, cleaned and replaced without difficulty.      ICD-10-CM   1. Pessary maintenance, Milex ring with support #4  Z46.89     2. Prolapse of vaginal vault after hysterectomy  N99.3        Ashley Wise will be sen back in 4 months for continued follow up.  Lazaro Arms, MD  07/29/2023 9:03 AM

## 2023-08-24 DIAGNOSIS — L139 Bullous disorder, unspecified: Secondary | ICD-10-CM | POA: Diagnosis not present

## 2023-10-07 DIAGNOSIS — D225 Melanocytic nevi of trunk: Secondary | ICD-10-CM | POA: Diagnosis not present

## 2023-10-07 DIAGNOSIS — D485 Neoplasm of uncertain behavior of skin: Secondary | ICD-10-CM | POA: Diagnosis not present

## 2023-10-07 DIAGNOSIS — X32XXXA Exposure to sunlight, initial encounter: Secondary | ICD-10-CM | POA: Diagnosis not present

## 2023-10-07 DIAGNOSIS — L570 Actinic keratosis: Secondary | ICD-10-CM | POA: Diagnosis not present

## 2023-10-07 DIAGNOSIS — Z1283 Encounter for screening for malignant neoplasm of skin: Secondary | ICD-10-CM | POA: Diagnosis not present

## 2023-10-07 DIAGNOSIS — R233 Spontaneous ecchymoses: Secondary | ICD-10-CM | POA: Diagnosis not present

## 2023-11-04 DIAGNOSIS — E1129 Type 2 diabetes mellitus with other diabetic kidney complication: Secondary | ICD-10-CM | POA: Diagnosis not present

## 2023-11-11 DIAGNOSIS — I10 Essential (primary) hypertension: Secondary | ICD-10-CM | POA: Diagnosis not present

## 2023-11-11 DIAGNOSIS — E1122 Type 2 diabetes mellitus with diabetic chronic kidney disease: Secondary | ICD-10-CM | POA: Diagnosis not present

## 2023-11-30 ENCOUNTER — Ambulatory Visit: Payer: Medicare Other | Admitting: Obstetrics & Gynecology

## 2023-12-02 ENCOUNTER — Ambulatory Visit: Payer: Medicare Other | Admitting: Adult Health

## 2023-12-02 ENCOUNTER — Encounter: Payer: Self-pay | Admitting: Adult Health

## 2023-12-02 VITALS — BP 139/70 | HR 80 | Ht 62.0 in | Wt 134.0 lb

## 2023-12-02 DIAGNOSIS — N993 Prolapse of vaginal vault after hysterectomy: Secondary | ICD-10-CM | POA: Diagnosis not present

## 2023-12-02 DIAGNOSIS — Z4689 Encounter for fitting and adjustment of other specified devices: Secondary | ICD-10-CM | POA: Diagnosis not present

## 2023-12-02 DIAGNOSIS — I10 Essential (primary) hypertension: Secondary | ICD-10-CM | POA: Diagnosis not present

## 2023-12-02 NOTE — Progress Notes (Signed)
  Subjective:     Patient ID: Ashley Wise, female   DOB: 09-29-1941, 82 y.o.   MRN: [id number]  HPI Ashley Wise is a 82 year old white female, widowed, sp hysterectomy in for pessary maintenance. PCP is Dr Ashley Wise  Review of Systems For pessary maintenance Denies any bleeding or discharge with odor Reviewed past medical,surgical, social and family history. Reviewed medications and allergies.     Objective:   Physical Exam  BP 139/70 (BP Location: Right Arm, Patient Position: Sitting, Cuff Size: Normal)   Pulse 80   Ht 5\' 2"  (1.575 m)   Wt 134 lb (60.8 kg)   BMI 24.51 kg/m     Skin warm and dry.Pelvic: external genitalia is normal in appearance no lesions, vagina: pessary removed, +prolapse, no lesions, pessary washed with soap and water and dried and easily reinserted, urethra has no lesions or masses noted, cervix and uterus are absent, adnexa: no masses or tenderness noted. Bladder is non tender and no masses felt.  Fall risk is low  Upstream - 12/02/23 1011       Pregnancy Intention Screening   Does the patient want to become pregnant in the next year? N/A    Does the patient's partner want to become pregnant in the next year? N/A    Would the patient like to discuss contraceptive options today? N/A      Contraception Wrap Up   Current Method Abstinence   PM   End Method Abstinence   PM   Contraception Counseling Provided No            Examination chaperoned by Malachy Mood LPN  Assessment:     1. Pessary maintenance, Milex ring with support #4 Cleaned and reinserted   2. Prolapse of vaginal vault after hysterectomy   3. Hypertension, unspecified type Take BP meds and follow up with PCP    Plan:     Return in 4 months for pessary maintenance

## 2024-03-06 ENCOUNTER — Other Ambulatory Visit (HOSPITAL_COMMUNITY): Payer: Self-pay | Admitting: Internal Medicine

## 2024-03-06 DIAGNOSIS — Z1231 Encounter for screening mammogram for malignant neoplasm of breast: Secondary | ICD-10-CM

## 2024-03-08 DIAGNOSIS — E1129 Type 2 diabetes mellitus with other diabetic kidney complication: Secondary | ICD-10-CM | POA: Diagnosis not present

## 2024-03-15 ENCOUNTER — Ambulatory Visit (HOSPITAL_COMMUNITY)

## 2024-03-15 DIAGNOSIS — E1122 Type 2 diabetes mellitus with diabetic chronic kidney disease: Secondary | ICD-10-CM | POA: Diagnosis not present

## 2024-03-15 DIAGNOSIS — I10 Essential (primary) hypertension: Secondary | ICD-10-CM | POA: Diagnosis not present

## 2024-03-16 ENCOUNTER — Ambulatory Visit (HOSPITAL_COMMUNITY)
Admission: RE | Admit: 2024-03-16 | Discharge: 2024-03-16 | Disposition: A | Discharge status: Home / Self Care | Source: Ambulatory Visit | Attending: Internal Medicine | Admitting: Internal Medicine

## 2024-03-16 DIAGNOSIS — Z1231 Encounter for screening mammogram for malignant neoplasm of breast: Secondary | ICD-10-CM | POA: Diagnosis not present

## 2024-04-03 ENCOUNTER — Encounter: Payer: Self-pay | Admitting: Obstetrics & Gynecology

## 2024-04-03 ENCOUNTER — Ambulatory Visit: Payer: Medicare Other | Admitting: Obstetrics & Gynecology

## 2024-04-03 VITALS — BP 148/75 | HR 72

## 2024-04-03 DIAGNOSIS — Z4689 Encounter for fitting and adjustment of other specified devices: Secondary | ICD-10-CM | POA: Diagnosis not present

## 2024-04-03 DIAGNOSIS — N993 Prolapse of vaginal vault after hysterectomy: Secondary | ICD-10-CM | POA: Diagnosis not present

## 2024-04-03 NOTE — Progress Notes (Signed)
 Chief Complaint  Patient presents with   pessaary maintenance    Blood pressure (!) 148/75, pulse 72.  Ashley Wise presents today for routine follow up related to her pessary.   She uses a Milex ring with support #4 She reports no vaginal discharge and no vaginal bleeding   Likert scale(1 not bothersome -5 very bothersome)  :  1  Exam reveals no undue vaginal mucosal pressure of breakdown, no discharge and no vaginal bleeding.  Vaginal Epithelial Abnormality Classification System:   0 0    No abnormalities 1    Epithelial erythema 2    Granulation tissue 3    Epithelial break or erosion, 1 cm or less 4    Epithelial break or erosion, 1 cm or greater  The pessary is removed, cleaned and replaced without difficulty.      ICD-10-CM   1. Pessary maintenance, Milex ring with support #4  Z46.89     2. Prolapse of vaginal vault after hysterectomy  N99.3        Ashley Wise will be sen back in 4 months for continued follow up.  Lazaro Arms, MD  04/03/2024 9:07 AM

## 2024-05-25 ENCOUNTER — Encounter: Payer: Self-pay | Admitting: Obstetrics & Gynecology

## 2024-05-25 ENCOUNTER — Ambulatory Visit: Admitting: Obstetrics & Gynecology

## 2024-05-25 VITALS — BP 191/83 | HR 92 | Wt 133.0 lb

## 2024-05-25 DIAGNOSIS — N993 Prolapse of vaginal vault after hysterectomy: Secondary | ICD-10-CM | POA: Diagnosis not present

## 2024-05-25 DIAGNOSIS — N3941 Urge incontinence: Secondary | ICD-10-CM

## 2024-05-25 DIAGNOSIS — Z4689 Encounter for fitting and adjustment of other specified devices: Secondary | ICD-10-CM

## 2024-05-25 LAB — POCT URINALYSIS DIPSTICK OB
Blood, UA: NEGATIVE
Glucose, UA: NEGATIVE
Ketones, UA: NEGATIVE
Leukocytes, UA: NEGATIVE
Nitrite, UA: NEGATIVE
POC,PROTEIN,UA: NEGATIVE

## 2024-05-25 NOTE — Progress Notes (Signed)
 Chief Complaint  Patient presents with   Pessary Check      83 y.o. G2P2 No LMP recorded. Patient is postmenopausal. The current method of family planning is menopausal.  Outpatient Encounter Medications as of 05/25/2024  Medication Sig Note   Cholecalciferol (D3) 25 MCG (1000 UT) capsule Take 1,000 Units by mouth daily.    diphenhydrAMINE HCl (BENADRYL PO) Take by mouth.    levothyroxine (SYNTHROID , LEVOTHROID) 88 MCG tablet Take 88 mcg by mouth daily before breakfast. 03/09/2017: 1 hour before breakfast   losartan (COZAAR) 50 MG tablet Take 50 mg by mouth in the morning and at bedtime.    metFORMIN (GLUCOPHAGE) 500 MG tablet Take 500 mg by mouth daily with breakfast.    rosuvastatin (CRESTOR) 10 MG tablet Take 10 mg by mouth daily.    No facility-administered encounter medications on file as of 05/25/2024.    Subjective Pt having some incontinence and feeling something is not right about her pessary which has never happened before  On exam it is rotated about 45 degrees no mucosal injury and her bladder has slipped past it  Will culture urine, UA was negative   Past Medical History:  Diagnosis Date   Arthritis    HANDS   Cancer (HCC)     L BREAST CANCER   Diabetes mellitus without complication (HCC)    History of breast cancer 1993   left -MASTECTOMY AND AXILLARY NODE DISSECTION -NO CHEMO OR RADIATION   Hypertension    Hypothyroidism    Multinodular goiter    ENLARGED THROID AND NODULES--NO PROBLEMS SWALLOWING, OCCAS RASPY VOICE AND FEELING THAT SHE NEEDS TO CLEAR HER THROAT, OCCAS HAND TREMOR.  OCCAS PALPITATIONS   PONV (postoperative nausea and vomiting)    Rheumatic fever     Past Surgical History:  Procedure Laterality Date   APPENDECTOMY     BREAST SURGERY  1993   LEFT MASTECTOMY AND AXILLARY NODE DISSECTION   CATARACT EXTRACTION W/PHACO Left 02/08/2017   Procedure: CATARACT EXTRACTION PHACO AND INTRAOCULAR LENS PLACEMENT LEFT EYE;  Surgeon: Anner Kill, MD;  Location: [facility];  Service: Ophthalmology;  Laterality: Left;  CDE:  11.49   CATARACT EXTRACTION W/PHACO Right 03/18/2017   Procedure: CATARACT EXTRACTION PHACO AND INTRAOCULAR LENS PLACEMENT (IOC) CDE= 6.35;  Surgeon: Anner Kill, MD;  Location: [facility];  Service: Ophthalmology;  Laterality: Right;  right   CESAREAN SECTION  1964, 1974   MASTECTOMY  1993   left   THYROIDECTOMY  01/25/2012   Procedure: THYROIDECTOMY;  Surgeon: Keitha Pata, MD;  Location: [facility];  Service: General;  Laterality: N/A;  Total Thyroidectomy   TONSILLECTOMY     TONSILLECTOMY      OB History     Gravida  2   Para  2   Term      Preterm      AB      Living         SAB      IAB      Ectopic      Multiple      Live Births              Allergies  Allergen Reactions   Codeine Nausea Only    Social History   Socioeconomic History   Marital status: Widowed    Spouse name: Not on file   Number of children: Not on file   Years of education: Not on file   Highest  education level: Not on file  Occupational History   Not on file  Tobacco Use   Smoking status: Never   Smokeless tobacco: Never  Vaping Use   Vaping status: Never Used  Substance and Sexual Activity   Alcohol use: No   Drug use: No   Sexual activity: Not Currently    Birth control/protection: Post-menopausal, Abstinence  Other Topics Concern   Not on file  Social History Narrative   Not on file   Social Drivers of Health   Financial Resource Strain: Not on file  Food Insecurity: Not on file  Transportation Needs: Not on file  Physical Activity: Not on file  Stress: Not on file  Social Connections: Not on file    Family History  Problem Relation Age of Onset   Cancer Father        colon, stomach, liver   Heart disease Father    Diabetes Mother    Hyperlipidemia Mother    Diabetes Son     Medications:       Current Outpatient Medications:    Cholecalciferol (D3) 25 MCG (1000 UT) capsule,  Take 1,000 Units by mouth daily., Disp: , Rfl:    diphenhydrAMINE HCl (BENADRYL PO), Take by mouth., Disp: , Rfl:    levothyroxine (SYNTHROID , LEVOTHROID) 88 MCG tablet, Take 88 mcg by mouth daily before breakfast., Disp: , Rfl:    losartan (COZAAR) 50 MG tablet, Take 50 mg by mouth in the morning and at bedtime., Disp: , Rfl:    metFORMIN (GLUCOPHAGE) 500 MG tablet, Take 500 mg by mouth daily with breakfast., Disp: , Rfl:    rosuvastatin (CRESTOR) 10 MG tablet, Take 10 mg by mouth daily., Disp: , Rfl:   Objective Blood pressure (!) 191/83, pulse 92, weight 133 lb (60.3 kg).  As above  Pertinent ROS   Labs or studies pending    Impression + Management Plan: Diagnoses this Encounter::   ICD-10-CM   1. Urge incontinence of urine  N39.41 Urinalysis, Routine w reflex microscopic    Urine Culture    POC Urinalysis Dipstick OB    2. Prolapse of vaginal vault after hysterectomy  N99.3     3. Pessary maintenance, Milex ring with support #4  Z46.89     May need OAB meds if this continues and/or conisder larger pessary     Medications prescribed during  this encounter: No orders of the defined types were placed in this encounter.   Labs or Scans Ordered during this encounter: No orders of the defined types were placed in this encounter.     Follow up Return in about 2 weeks (around 06/08/2024) for Follow up, with Dr Randolm Butte.

## 2024-05-27 LAB — URINALYSIS, ROUTINE W REFLEX MICROSCOPIC
Bilirubin, UA: NEGATIVE
Glucose, UA: NEGATIVE
Ketones, UA: NEGATIVE
Leukocytes,UA: NEGATIVE
Nitrite, UA: NEGATIVE
Protein,UA: NEGATIVE
RBC, UA: NEGATIVE
Specific Gravity, UA: 1.009 (ref 1.005–1.030)
Urobilinogen, Ur: 0.2 mg/dL (ref 0.2–1.0)
pH, UA: 6 (ref 5.0–7.5)

## 2024-05-28 LAB — URINE CULTURE: Organism ID, Bacteria: NO GROWTH

## 2024-05-31 ENCOUNTER — Telehealth: Payer: Self-pay

## 2024-05-31 NOTE — Telephone Encounter (Signed)
 Patient would like for a nurse to call her about her test results from last week.

## 2024-06-01 NOTE — Telephone Encounter (Signed)
 Called and left detailed message that the culture was negative.

## 2024-06-15 ENCOUNTER — Ambulatory Visit: Admitting: Obstetrics & Gynecology

## 2024-06-15 ENCOUNTER — Encounter: Payer: Self-pay | Admitting: Obstetrics & Gynecology

## 2024-06-15 VITALS — BP 130/71 | Wt 123.0 lb

## 2024-06-15 DIAGNOSIS — Z4689 Encounter for fitting and adjustment of other specified devices: Secondary | ICD-10-CM

## 2024-06-15 DIAGNOSIS — N814 Uterovaginal prolapse, unspecified: Secondary | ICD-10-CM

## 2024-06-15 NOTE — Progress Notes (Signed)
 Chief Complaint  Patient presents with   Pessary Check    Follow up. Has slipped may need meds   Medication Consultation    Blood pressure 130/71, weight 123 lb (55.8 kg).  Ashley Wise presents today for routine follow up related to her pessary.   She uses a Milex ring with support #5 ^ #6 today She reports no vaginal discharge and no vaginal bleeding   Likert scale(1 not bothersome -5 very bothersome)  :  1  Exam reveals no undue vaginal mucosal pressure of breakdown, no discharge and no vaginal bleeding.  Vaginal Epithelial Abnormality Classification System:   0 0    No abnormalities 1    Epithelial erythema 2    Granulation tissue 3    Epithelial break or erosion, 1 cm or less 4    Epithelial break or erosion, 1 cm or greater  The pessary is removed, cleaned and replaced without difficulty.      ICD-10-CM   1. Pessary maintenance, Milex ring with support #5 ^ #6 today with good fit and no symptoms  Z46.89     2. Uterine prolapse  N81.4        Ashley Wise will be sen back in 1 months for continued follow up.  Wendelyn Halter, MD  06/15/2024 11:03 AM

## 2024-06-29 DIAGNOSIS — Z139 Encounter for screening, unspecified: Secondary | ICD-10-CM | POA: Diagnosis not present

## 2024-06-29 DIAGNOSIS — E1129 Type 2 diabetes mellitus with other diabetic kidney complication: Secondary | ICD-10-CM | POA: Diagnosis not present

## 2024-06-29 DIAGNOSIS — E785 Hyperlipidemia, unspecified: Secondary | ICD-10-CM | POA: Diagnosis not present

## 2024-06-29 DIAGNOSIS — I10 Essential (primary) hypertension: Secondary | ICD-10-CM | POA: Diagnosis not present

## 2024-06-29 DIAGNOSIS — E039 Hypothyroidism, unspecified: Secondary | ICD-10-CM | POA: Diagnosis not present

## 2024-07-06 DIAGNOSIS — Z0001 Encounter for general adult medical examination with abnormal findings: Secondary | ICD-10-CM | POA: Diagnosis not present

## 2024-07-06 DIAGNOSIS — R800 Isolated proteinuria: Secondary | ICD-10-CM | POA: Diagnosis not present

## 2024-07-06 DIAGNOSIS — E785 Hyperlipidemia, unspecified: Secondary | ICD-10-CM | POA: Diagnosis not present

## 2024-07-06 DIAGNOSIS — E1129 Type 2 diabetes mellitus with other diabetic kidney complication: Secondary | ICD-10-CM | POA: Diagnosis not present

## 2024-07-06 DIAGNOSIS — Z853 Personal history of malignant neoplasm of breast: Secondary | ICD-10-CM | POA: Diagnosis not present

## 2024-07-06 DIAGNOSIS — I10 Essential (primary) hypertension: Secondary | ICD-10-CM | POA: Diagnosis not present

## 2024-07-06 DIAGNOSIS — E039 Hypothyroidism, unspecified: Secondary | ICD-10-CM | POA: Diagnosis not present

## 2024-07-17 ENCOUNTER — Ambulatory Visit: Admitting: Obstetrics & Gynecology

## 2024-07-17 ENCOUNTER — Encounter: Payer: Self-pay | Admitting: Obstetrics & Gynecology

## 2024-07-17 VITALS — BP 170/76 | HR 71 | Ht 62.0 in | Wt 132.5 lb

## 2024-07-17 DIAGNOSIS — Z4689 Encounter for fitting and adjustment of other specified devices: Secondary | ICD-10-CM

## 2024-07-17 DIAGNOSIS — N814 Uterovaginal prolapse, unspecified: Secondary | ICD-10-CM

## 2024-07-17 NOTE — Progress Notes (Signed)
 Chief Complaint  Patient presents with   Pessary Check    Pessary is fitting better!!!    Blood pressure (!) 147/75, pulse 67, height 5' 2 (1.575 m), weight 132 lb 8 oz (60.1 kg).  Ashley Wise presents today for routine follow up related to her pessary.   She uses a Milex ring with support #6 She reports no vaginal discharge and no vaginal bleeding   Likert scale(1 not bothersome -5 very bothersome)  :  1  Exam reveals no undue vaginal mucosal pressure of breakdown, no discharge and no vaginal bleeding.  Vaginal Epithelial Abnormality Classification System:   0 0    No abnormalities 1    Epithelial erythema 2    Granulation tissue 3    Epithelial break or erosion, 1 cm or less 4    Epithelial break or erosion, 1 cm or greater  The pessary is removed, cleaned and replaced without difficulty.    No diagnosis found.   [name] will be sen back in 4 months for continued follow up.  Vonn VEAR Inch, MD  07/17/2024 8:56 AM

## 2024-07-25 DIAGNOSIS — E119 Type 2 diabetes mellitus without complications: Secondary | ICD-10-CM | POA: Diagnosis not present

## 2024-10-03 DIAGNOSIS — E039 Hypothyroidism, unspecified: Secondary | ICD-10-CM | POA: Diagnosis not present

## 2024-10-03 DIAGNOSIS — Z139 Encounter for screening, unspecified: Secondary | ICD-10-CM | POA: Diagnosis not present

## 2024-10-03 DIAGNOSIS — Z79899 Other long term (current) drug therapy: Secondary | ICD-10-CM | POA: Diagnosis not present

## 2024-10-03 DIAGNOSIS — E1129 Type 2 diabetes mellitus with other diabetic kidney complication: Secondary | ICD-10-CM | POA: Diagnosis not present

## 2024-10-03 DIAGNOSIS — R739 Hyperglycemia, unspecified: Secondary | ICD-10-CM | POA: Diagnosis not present

## 2024-10-10 DIAGNOSIS — E030 Congenital hypothyroidism with diffuse goiter: Secondary | ICD-10-CM | POA: Diagnosis not present

## 2024-10-10 DIAGNOSIS — E1129 Type 2 diabetes mellitus with other diabetic kidney complication: Secondary | ICD-10-CM | POA: Diagnosis not present

## 2024-10-10 DIAGNOSIS — R809 Proteinuria, unspecified: Secondary | ICD-10-CM | POA: Diagnosis not present

## 2024-11-06 ENCOUNTER — Ambulatory Visit: Admitting: Obstetrics & Gynecology

## 2024-11-10 ENCOUNTER — Ambulatory Visit: Admitting: Obstetrics & Gynecology

## 2024-11-10 ENCOUNTER — Encounter: Payer: Self-pay | Admitting: Obstetrics & Gynecology

## 2024-11-10 VITALS — BP 154/75 | HR 75 | Ht 62.0 in | Wt 130.0 lb

## 2024-11-10 DIAGNOSIS — N814 Uterovaginal prolapse, unspecified: Secondary | ICD-10-CM

## 2024-11-10 DIAGNOSIS — Z4689 Encounter for fitting and adjustment of other specified devices: Secondary | ICD-10-CM | POA: Diagnosis not present

## 2024-11-10 DIAGNOSIS — I10 Essential (primary) hypertension: Secondary | ICD-10-CM | POA: Diagnosis not present

## 2024-11-10 DIAGNOSIS — N993 Prolapse of vaginal vault after hysterectomy: Secondary | ICD-10-CM

## 2024-11-10 DIAGNOSIS — N3941 Urge incontinence: Secondary | ICD-10-CM

## 2024-11-10 NOTE — Progress Notes (Addendum)
 Chief Complaint  Patient presents with   Pessary Check    Blood pressure (!) 154/75, pulse 75, height 5' 2 (1.575 m), weight 130 lb (59 kg).  Ashley Wise presents today for routine follow up related to her pessary.   She uses a Milex ring with support #6 She reports no vaginal discharge and no vaginal bleeding   Likert scale(1 not bothersome -5 very bothersome)  :  1  Exam reveals no undue vaginal mucosal pressure of breakdown, no discharge and no vaginal bleeding.  Vaginal Epithelial Abnormality Classification System:   0 0    No abnormalities 1    Epithelial erythema 2    Granulation tissue 3    Epithelial break or erosion, 1 cm or less 4    Epithelial break or erosion, 1 cm or greater  The pessary is removed, cleaned and replaced without difficulty.      ICD-10-CM   1. Pessary maintenance, Milex ring with support #5 ^ #6 with good fit and no symptoms  Z46.89     2. Uterine prolapse  N81.4     3. Urge incontinence of urine  N39.41     4. Hypertension, unspecified type  I10        [name] will be sen back in 4 months for continued follow up.  Vonn VEAR Inch, MD  11/10/2024 9:18 AM

## 2024-12-07 ENCOUNTER — Ambulatory Visit: Admitting: Podiatry

## 2024-12-07 ENCOUNTER — Ambulatory Visit

## 2024-12-07 VITALS — Ht 62.0 in | Wt 130.0 lb

## 2024-12-07 DIAGNOSIS — M79672 Pain in left foot: Secondary | ICD-10-CM | POA: Diagnosis not present

## 2024-12-07 DIAGNOSIS — M19072 Primary osteoarthritis, left ankle and foot: Secondary | ICD-10-CM

## 2024-12-13 NOTE — Progress Notes (Signed)
°  Subjective:  Patient ID: Ashley Wise, female    DOB: 02/15/1941,  MRN: [id number]  Chief Complaint  Patient presents with   Foot Pain    RM 7 Patient is here left foot pain on the dorsal aspect of the foot. Pt states wearing a brace, Brooks and OOFOS for support.    83 y.o. female presents with the above complaint. History confirmed with patient.   Objective:  Physical Exam: warm, good capillary refill, no trophic changes or ulcerative lesions, normal DP and PT pulses, normal sensory exam, and moderate tenderness left dorsal midfoot.   Radiographs: Multiple views x-ray of the left foot: degenerative changes of the left third tarsometatarsal joint Assessment:   1. Arthritis of midtarsal joint of left foot      Plan:  Patient was evaluated and treated and all questions answered.  Arthritis -XR reviewed with patient -Discussed treatment of osteoarthritis of the left foot including Voltaren gel Tylenol  arthritis strength corticosteroids and low-dose radiation therapy.  Activity and shoes as tolerated.  She will trial Voltaren gel and arthritis Tylenol  and to see if this helps, if not improving she will let me know if she would like to proceed with referral for radiation therapy and/or injection.  No follow-ups on file.
